# Patient Record
Sex: Female | Born: 1950 | Race: Black or African American | Hispanic: No | Marital: Single | Smoking: Never smoker
Health system: Southern US, Community
[De-identification: ages and names within clinical notes are randomized; demographics above are authoritative.]

## PROBLEM LIST (undated history)

## (undated) DIAGNOSIS — I10 Essential (primary) hypertension: Secondary | ICD-10-CM

## (undated) DIAGNOSIS — E785 Hyperlipidemia, unspecified: Secondary | ICD-10-CM

## (undated) HISTORY — PX: TOTAL VAGINAL HYSTERECTOMY: SHX2548

## (undated) HISTORY — PX: TONSILLECTOMY: SUR1361

## (undated) HISTORY — DX: Hyperlipidemia, unspecified: E78.5

## (undated) HISTORY — PX: APPENDECTOMY: SHX54

## (undated) HISTORY — PX: CHOLECYSTECTOMY: SHX55

---

## 2006-08-15 HISTORY — PX: NM MYOCAR PERF WALL MOTION: HXRAD629

## 2007-08-21 ENCOUNTER — Encounter: Admission: RE | Admit: 2007-08-21 | Discharge: 2007-08-21 | Payer: Self-pay | Admitting: Internal Medicine

## 2007-10-22 ENCOUNTER — Inpatient Hospital Stay (HOSPITAL_COMMUNITY): Admission: AD | Admit: 2007-10-22 | Discharge: 2007-10-24 | Payer: Self-pay | Admitting: General Surgery

## 2007-10-22 ENCOUNTER — Encounter (INDEPENDENT_AMBULATORY_CARE_PROVIDER_SITE_OTHER): Payer: Self-pay | Admitting: General Surgery

## 2007-10-28 ENCOUNTER — Ambulatory Visit (HOSPITAL_COMMUNITY): Admission: RE | Admit: 2007-10-28 | Discharge: 2007-10-28 | Payer: Self-pay | Admitting: Gastroenterology

## 2010-07-04 NOTE — Op Note (Signed)
Sabrina Baldwin, Sabrina Baldwin           ACCOUNT NO.:  [id number]   MEDICAL RECORD NO.:  [id number]          PATIENT TYPE:  INP   LOCATION:  1409                         FACILITY:  [facility]   PHYSICIAN:  John C. Madilyn Fireman, M.D.    DATE OF BIRTH:  Jul 27, 1950   DATE OF PROCEDURE:  10/23/2007  DATE OF DISCHARGE:                               OPERATIVE REPORT   PROCEDURE:  Endoscopic retrograde cholangiopancreatography.   INDICATIONS FOR PROCEDURE:  Common bile duct stones seen on  intraoperative cholangiogram.   DESCRIPTION OF PROCEDURE:  The patient was placed in the prone position  and placed on the pulse monitor with continuous low-flow oxygen  delivered by nasal cannula.  She was sedated with 150 mcg of IV fentanyl  and 10 mg of IV Versed, as well as 1 mg of IV glucagon.  The Olympus  side-viewing endoscope was advanced blindly in the oropharynx, esophagus  and stomach.  The pylorus was traversed with the papilla of Vater  located on the medial duodenal wall  was  enlarged appearing with a  downward pointing very small ampulla.  This was initially cannulated  with a Wilson-Cook sphincterotome resulting in opacification of the  pancreatic duct which appeared normal.  With repositioning, shallow  cannulation of the common bile could be achieved.  However, with shallow  cannulation, the distal CBD appeared to be in a corkscrew or loop  configuration.  Despite two different catheters and Dr. Ewing Schlein assisting,  we were unable to straighten this loop to get the wire to advance into  the proximal common bile duct.  Proximal to the tortuous area, there  were seen 2-3 small stones.  After approximately 1-1/2 hours of  attempts, the procedure was terminated with no sphincterotomy performed  and no free access into the proximal common bile duct.  The patient was  then returned to the recovery room in stable condition.  She tolerated  the procedure well and there were no immediate complications.   IMPRESSION:  Distal common bile duct stone, unable to extract due to  tortuous distal common bile duct.   PLAN:  Observe overnight for complications and will probably refer her  to a tertiary care center for ERCP attempt.           ______________________________  Everardo All. Madilyn Fireman, M.D.     JCH/MEDQ  D:  10/23/2007  T:  10/23/2007  Job:  [id number]   cc:   Juanetta Gosling, MD  [address]  [place] [address]

## 2010-07-04 NOTE — Consult Note (Signed)
Sabrina Baldwin, Sabrina Baldwin           ACCOUNT NO.:  [id number]   MEDICAL RECORD NO.:  [id number]          PATIENT TYPE:  OIB   LOCATION:  1409                         FACILITY:  [facility]   PHYSICIAN:  Shirley Friar, MDDATE OF BIRTH:  26-Dec-1950   DATE OF CONSULTATION:  10/22/2007  DATE OF DISCHARGE:                                 CONSULTATION   REASON:  Common bile duct stones.   HISTORY OF PRESENT ILLNESS:  Sabrina Baldwin is a pleasant 60 year old  black female status post laparoscopic cholecystectomy today for  symptomatic gallstones who was found to have a positive intraoperative  cholangiogram.  A filling defect was seen in the distal common bile duct  concerning for ductal stone.  Several smaller ductal stones were seen to  migrate from the common bile duct into the left hepatic duct under the  injection.  No leakage of contrast material was identified.  Patient has  done well postoperatively and denies any abdominal pain.  She last had  liver function test done on September 26, 2007 per chart record. It showed  ALT of 45, AST of 22, alkaline phosphatase of 120.   PAST MEDICAL HISTORY:  1. Symptomatic gallstones as stated above.  2. Hypertension.  3. Status post appendectomy.  4. Status post total abdominal hysterectomy.  5. Status post bilateral tubal ligation.   CURRENT MEDICINES:  1. Cefoxitin.  2. Hydrocodone p.r.n.  3. Morphine p.r.n.  4. Zofran.   ALLERGIES:  NO KNOWN DRUG ALLERGIES.   FAMILY HISTORY:  Negative for gallstone disease.   SOCIAL HISTORY:  Occasional alcohol, denies smoking.   REVIEW OF SYSTEMS:  Negative from GI standpoint except for as stated  above.   PHYSICAL EXAM:  Temperature 97.4, pulse 51, blood pressure 120/80,  weight of 70 kg, O2 sat 99% on 2 liters.  GENERAL:  Lethargic, but arousable.  No acute distress.  ABDOMEN: Soft, nontender, decreased bowel sounds.   IMPRESSION:  A 60 year old black female with common bile duct stones and  positive intraoperative cholangiogram for common bile duct stones.  Would do liver function tests and lipase tonight and repeat LFTs in the  morning.  The patient may need to have a ERCP on October 23, 2007  pending these labs and her clinical improvement.  Risks and benefits  ERCP were discussed.  She agrees to proceed if necessary.      Shirley Friar, MD  Electronically Signed     VCS/MEDQ  D:  10/22/2007  T:  10/22/2007  Job:  [id number]   cc:   Juanetta Gosling, MD  [address]  [place] [address]   Everardo All. Madilyn Fireman, M.D.  Fax: [phone]

## 2010-07-04 NOTE — Op Note (Signed)
Sabrina Baldwin, Sabrina Baldwin NO.:  [id number]   MEDICAL RECORD NO.:  [id number]          PATIENT TYPE:  AMB   LOCATION:  DAY                          FACILITY:  [facility]   PHYSICIAN:  Juanetta Gosling, MDDATE OF BIRTH:  01-15-1951   DATE OF PROCEDURE:  10/22/2007  DATE OF DISCHARGE:                               OPERATIVE REPORT   PREOPERATIVE DIAGNOSES:  1. Symptomatic cholelithiasis.  2. Elevated liver function tests.   POSTOPERATIVE DIAGNOSES:  1. Symptomatic cholelithiasis.  2. Elevated liver function tests.  3. Retained common duct stones.   PROCEDURE:  Laparoscopic cholecystectomy with intraoperative  cholangiogram.   SURGEON:  Juanetta Gosling, MD.   ASSISTANT:  Ollen Gross. Vernell Morgans, M.D.   ANESTHESIA:  General.   SPECIMENS:  Gallbladder to pathology.   ESTIMATED BLOOD LOSS:  Minimal.   FINDINGS:  Very contracted and scarred in gallbladder with an IOC with  retained stones.   DISPOSITION:  To PACU stable.   COMPLICATIONS:  None.   INDICATIONS:  Mrs. Sabrina Baldwin is a 60 year old female with approximately a  year and a half of symptoms of indigestion and intermittent right upper  quadrant pain, especially with spicy foods.  She was seen by her primary  care physician for these complaints and had a mildly elevated GGT on her  lab exam, was sent for a right upper quadrant ultrasound that showed a  mildly echogenic liver, mildly prominent possibly due to a nonvisualized  distal common duct stone and cholelithiasis.  Her prior history includes  hypertension appendectomy and a total abdominal hysterectomy.  I  counseled her for a laparoscopic cholecystectomy with a cholangiogram  due to her abnormal GGT and alkaline phosphatase.   PROCEDURE:  After informed consent was obtained, the patient was  administered 1 gram of cefoxitin.  She was then taken to the operating  room where she had sequential compression devices placed on her lower  extremities.  She  then underwent general endotracheal anesthesia without  complication.  Her abdomen was then prepped and draped in standard  sterile surgical fashion  after a surgical time-out was performed.  A 10  mL vertical incision was then made below her belly button and dissection  was carried out down to the level of the fascia.  This was incised  sharply.  The peritoneum was entered bluntly.  The 0 Vicryl pursestring  suture was then placed and the Hasson trocar inserted into this  incision.  Abdomen was insufflated to 15 mmHg of pressure which she  tolerated well.  Three further 5 mm ports were placed in the epigastrium  and right upper quadrant under direct vision after infiltration with  local anesthetic.  Her gallbladder was noted to be very adherent to her  omentum and there was a large amount of scarring and her gallbladder  also appeared very contracted.  Multiple adhesions were lysed to the  gallbladder with a small amount of liver bleeding during this portion of  the procedure.  The triangle of Calot was then dissected.  The cystic  artery was clearly identified as was the cystic duct obtaining the  critical view of safety.  Her cystic duct appeared very large in nature.  I first clipped the cystic artery and cut her cystic artery and then put  a clip on the distal cystic duct.  I was still concerned that there was  gallbladder distal to where I was versus a dilated cystic duct so I did  shoot the cholangiogram for anatomy as well as her LFTs.  I inserted a  Cook catheter in the right upper quadrant and made a cut in the cystic  duct, inserted the catheter into this and clipped it into position.  Cholangiogram was then performed with the findings  that I was in the  cystic duct.  There was filling of both the left and right hepatic  radicals.  There was flow into the duodenum but there were multiple  abnormalities consistent with stones on her cholangiogram.  Following  this, the abdomen  was reinsufflated.  The catheter and clip were  removed.  The cystic duct was then clipped and divided.  There were some  stones milked out of this that were removed prior to this.  The  electrocautery was then used to remove the gallbladder from the liver  bed.  There were 2 other structures that might have been small ducts of  Luschka that I also clipped and divided.  As the gallbladder was about  to be removed, we held it cephalad and irrigated it.  There was no  evidence of any bleeding or bile leakage.  I did place an Endoloop using  a [place] Endoloop on the cystic duct stump due to the fact that she  may get an ERCP postoperatively.  Hemostasis was observed.  Due to the  dissection in the area in the liver were this was stuck,  I did place a  piece of Surgicel in the bed of the gallbladder.  The gallbladder was  then removed with electrocautery.  The camera was moved to the  epigastric position.  EndoCatch bag was inserted.  The gallbladder was  placed in the bag and removed out of the umbilicus.  The umbilical  pursestring suture was then tied with good closure.  Abdomen was then  desufflated.  All ports were then removed.  Wounds were closed with a 4-  0 Monocryl and Dermabond were placed over these.  She tolerated this  well and was extubated in the operating room.  She will be admitted  overnight and I have placed a consult to the gastroenterologist.  We  will make her n.p.o. after midnight and recheck her LFTs in the morning  for a possible ERCP.      Juanetta Gosling, MD  Electronically Signed     MCW/MEDQ  D:  10/22/2007  T:  10/23/2007  Job:  [id number]

## 2010-07-04 NOTE — Op Note (Signed)
Sabrina Baldwin, Sabrina Baldwin           ACCOUNT NO.:  [id number]   MEDICAL RECORD NO.:  [id number]          PATIENT TYPE:  AMB   LOCATION:  ENDO                         FACILITY:  [facility]   PHYSICIAN:  Petra Kuba, M.D.    DATE OF BIRTH:  1950/04/29   DATE OF PROCEDURE:  10/28/2007  DATE OF DISCHARGE:                               OPERATIVE REPORT   PROCEDURE:  Endoscopic retrograde cholangiopancreatography,  sphincterotomy, and stone extraction.   INDICATIONS:  CBD stone on previous ERCP and intraop cholangiogram  elevated liver test.  Consent was signed after risks, benefits, methods,  and options were thoroughly discussed multiple times in the past.   MEDICINES USED:  1. Fentanyl 75 mcg.  2. Versed 10 mg.  3. Phenergan 12.5 mg.  4. Glucagon 0.5 mg.   PROCEDURE:  The side-viewing therapeutic video duodenoscope was inserted  by indirect vision into the stomach and advanced through normal pylorus  into the duodenum and her bulbous essentially unchanged ampulla was  brought into view.  Using the triple-lumen sphincterotome loaded with  the Jagwire, we were able to get CBD cannulation.  Unfortunately, we are  trying to advance the wire, we would lose our position.  After a few  attempts, we went ahead and rolled her on her side and on the first  attempt we were able to get deep selective cannulation.  CBD was  slightly dilated, no obvious stones were seen on the injection of  contrast,  the intrahepatics were normal.  The wire was advanced into  the intrahepatic.  We went ahead and proceeded with a medium-sized  sphincterotomy until we had adequate biliary drainage and can get the  fully bowed sphincterotome easily in and out of the duct.  We then  exchanged the sphincterotome for the adjustable 12-15 mm balloon and on  the first balloon pull through at 12 mm, the stone was delivered.  We  went ahead and proceeded with at least 4-5 balloon pull throughs.  We  could not withdraw the 15  through the patent sphincterotomy site but we  could pull into the distal part of the duct and then lowered to the 12  and no other stones or debris were seen or removed.  The balloon did  pass readily through the patent sphincterotomy site and was on the 12 mm  mark.  We did try to do an occlusion cholangiogram twice.  Unfortunately, there was too much air in the system and could not tell  residual stones versus air bubbles and after prolonged effort, we  elected to stop the procedure.  There was adequate biliary drainage.  Scope was removed.  The patient tolerated the procedure well.  There was  no obvious immediate complications.   ENDOSCOPIC DIAGNOSES:  1. Bulbous ampulla.  2. No PD injections.  3. Unable to get deep selective cannulation on her belly but able to      get deep selective cannulation with her on the left side.  4. No stones seen on initial injection.  5. Medium sized sphincterotomy done.  6. Small stone removed with the adjustable 12-15-mm adjustable  balloon.  7. Multiple 12-mm balloon pull throughs without any further stones or      debris being delivered, unable to successfully get an occlusion      cholangiogram secondary to increased retained air in the system.   PLAN:  Observe for delayed complications.  If none, can go home today  and slowly advance diet.  We will ask the surgeon to repeat liver tests  on followup in a week or two and either myself or Dr. Madilyn Fireman have to see  back p.r.n.  We will hold aspirin and nonsteroidals for 2 weeks and  allow to go back to work next week.           ______________________________  Petra Kuba, M.D.     MEM/MEDQ  D:  10/28/2007  T:  10/29/2007  Job:  [id number]   cc:   Juanetta Gosling, MD  Everardo All. Madilyn Fireman, M.D.

## 2010-11-22 LAB — HEPATIC FUNCTION PANEL
AST: 271 — ABNORMAL HIGH
AST: 337 — ABNORMAL HIGH
Albumin: 3 — ABNORMAL LOW
Alkaline Phosphatase: 141 — ABNORMAL HIGH
Bilirubin, Direct: 0.3
Total Protein: 5.7 — ABNORMAL LOW
Total Protein: 6.5

## 2010-11-22 LAB — CBC
Platelets: 343
RDW: 12.9

## 2010-11-22 LAB — COMPREHENSIVE METABOLIC PANEL
ALT: 261 — ABNORMAL HIGH
ALT: 97 — ABNORMAL HIGH
AST: 149 — ABNORMAL HIGH
AST: 22
Albumin: 3.7
Alkaline Phosphatase: 131 — ABNORMAL HIGH
CO2: 29
Chloride: 106
Creatinine, Ser: 0.9
GFR calc Af Amer: >60
GFR calc non Af Amer: >60
Potassium: 3.6
Sodium: 137
Sodium: 142
Total Bilirubin: 1.1
Total Protein: 7.2

## 2010-11-22 LAB — PROTIME-INR: INR: 0.9

## 2010-11-22 LAB — ABO/RH: ABO/RH(D): O POS

## 2010-11-22 LAB — TYPE AND SCREEN

## 2012-04-07 ENCOUNTER — Encounter (HOSPITAL_COMMUNITY): Payer: Self-pay | Admitting: *Deleted

## 2012-04-07 ENCOUNTER — Other Ambulatory Visit: Payer: Self-pay | Admitting: Obstetrics and Gynecology

## 2012-04-07 DIAGNOSIS — Z1231 Encounter for screening mammogram for malignant neoplasm of breast: Secondary | ICD-10-CM

## 2012-04-15 ENCOUNTER — Ambulatory Visit (HOSPITAL_COMMUNITY)
Admission: RE | Admit: 2012-04-15 | Discharge: 2012-04-15 | Disposition: A | Discharge status: Home / Self Care | Payer: Self-pay | Source: Ambulatory Visit | Attending: Obstetrics and Gynecology | Admitting: Obstetrics and Gynecology

## 2012-04-15 ENCOUNTER — Encounter (HOSPITAL_COMMUNITY): Payer: Self-pay

## 2012-04-15 VITALS — BP 108/76 | Temp 98.1°F | Ht 66.0 in | Wt 133.0 lb

## 2012-04-15 DIAGNOSIS — Z01419 Encounter for gynecological examination (general) (routine) without abnormal findings: Secondary | ICD-10-CM

## 2012-04-15 HISTORY — DX: Essential (primary) hypertension: I10

## 2012-04-15 NOTE — Progress Notes (Signed)
No complaints today.  Pap Smear:  Pap smear completed today. Per patient last Pap smear was 3 years ago and normal. Per patient no history of an abnormal Pap smear. No Pap smear results in EPIC.  Physical exam: Breasts Breasts symmetrical. No skin abnormalities bilateral breasts. No nipple retraction bilateral breasts. No nipple discharge bilateral breasts. No lymphadenopathy. No lumps palpated bilateral breasts. No complaints of pain or tenderness on exam. Patient escorted to mammography for a screening mammogram.         Pelvic/Bimanual   Ext Genitalia No lesions, no swelling and no discharge observed on external genitalia.         Vagina Vagina pink and normal texture. No lesions and small amount of thick white discharge observed in vagina.          Cervix Cervix is present. Cervix pink and of normal texture. No discharge observed.     Uterus Uterus is present and palpable. Uterus in normal position and normal size.        Adnexae Bilateral ovaries present and palpable. No tenderness on palpation.          Rectovaginal No rectal exam completed today since patient had no rectal complaints. No skin abnormalities observed on exam.

## 2012-04-15 NOTE — Patient Instructions (Signed)
Taught patient how to perform BSE. Let her know [facility] will cover Pap smears every 3 years unless has a history of abnormal Pap smears. Let patient know will follow up with her within the next couple weeks with results by letter or phone. Patient verbalized understanding. Patient escorted to mammography for a screening mammogram.

## 2012-10-22 ENCOUNTER — Other Ambulatory Visit: Payer: Self-pay | Admitting: Cardiology

## 2012-10-22 NOTE — Telephone Encounter (Signed)
Rx was sent to pharmacy electronically. 

## 2012-11-01 ENCOUNTER — Other Ambulatory Visit: Payer: Self-pay | Admitting: Cardiology

## 2012-11-03 NOTE — Telephone Encounter (Signed)
Rx was sent to pharmacy electronically. 

## 2012-12-18 ENCOUNTER — Encounter: Payer: Self-pay | Admitting: Internal Medicine

## 2013-02-06 ENCOUNTER — Ambulatory Visit (AMBULATORY_SURGERY_CENTER): Payer: Self-pay

## 2013-02-06 ENCOUNTER — Encounter: Payer: Self-pay | Admitting: Internal Medicine

## 2013-02-06 VITALS — Ht 66.0 in | Wt 137.0 lb

## 2013-02-06 DIAGNOSIS — Z1211 Encounter for screening for malignant neoplasm of colon: Secondary | ICD-10-CM

## 2013-02-06 MED ORDER — SUPREP BOWEL PREP KIT 17.5-3.13-1.6 GM/177ML PO SOLN: 1.0000 | Freq: Once | ORAL | Status: DC

## 2013-02-27 ENCOUNTER — Ambulatory Visit (AMBULATORY_SURGERY_CENTER): Payer: BC Managed Care – PPO | Admitting: Internal Medicine

## 2013-02-27 ENCOUNTER — Encounter: Payer: Self-pay | Admitting: Internal Medicine

## 2013-02-27 VITALS — BP 113/72 | HR 61 | Temp 98.0°F | Resp 20 | Ht 66.0 in | Wt 137.0 lb

## 2013-02-27 DIAGNOSIS — Z1211 Encounter for screening for malignant neoplasm of colon: Secondary | ICD-10-CM

## 2013-02-27 MED ORDER — SODIUM CHLORIDE 0.9 % IV SOLN: 500.0000 mL | INTRAVENOUS | Status: DC

## 2013-02-27 NOTE — Patient Instructions (Addendum)
Your colonoscopy was normal. Prep was great.  Next routine colonoscopy in 10 years - 2025  I appreciate the opportunity to care for you. Iva Booparl E. Gessner, MD, FACG  YOU HAD AN ENDOSCOPIC PROCEDURE TODAY AT THE [facility]: Refer to the procedure report that was given to you for any specific questions about what was found during the examination.  If the procedure report does not answer your questions, please call your gastroenterologist to clarify.  If you requested that your care partner not be given the details of your procedure findings, then the procedure report has been included in a sealed envelope for you to review at your convenience later.  YOU SHOULD EXPECT: Some feelings of bloating in the abdomen. Passage of more gas than usual.  Walking can help get rid of the air that was put into your GI tract during the procedure and reduce the bloating. If you had a lower endoscopy (such as a colonoscopy or flexible sigmoidoscopy) you may notice spotting of blood in your stool or on the toilet paper. If you underwent a bowel prep for your procedure, then you may not have a normal bowel movement for a few days.  DIET: Your first meal following the procedure should be a light meal and then it is ok to progress to your normal diet.  A half-sandwich or bowl of soup is an example of a good first meal.  Heavy or fried foods are harder to digest and may make you feel nauseous or bloated.  Likewise meals heavy in dairy and vegetables can cause extra gas to form and this can also increase the bloating.  Drink plenty of fluids but you should avoid alcoholic beverages for 24 hours.  ACTIVITY: Your care partner should take you home directly after the procedure.  You should plan to take it easy, moving slowly for the rest of the day.  You can resume normal activity the day after the procedure however you should NOT DRIVE or use heavy machinery for 24 hours (because of the sedation medicines used during  the test).    SYMPTOMS TO REPORT IMMEDIATELY: A gastroenterologist can be reached at any hour.  During normal business hours, 8:30 AM to 5:00 PM Monday through Friday, call [phone].  After hours and on weekends, please call the GI answering service at [phone] who will take a message and have the physician on call contact you.   Following lower endoscopy (colonoscopy or flexible sigmoidoscopy):  Excessive amounts of blood in the stool  Significant tenderness or worsening of abdominal pains  Swelling of the abdomen that is new, acute  Fever of 100F or higher   FOLLOW UP: If any biopsies were taken you will be contacted by phone or by letter within the next 1-3 weeks.  Call your gastroenterologist if you have not heard about the biopsies in 3 weeks.  Our staff will call the home number listed on your records the next business day following your procedure to check on you and address any questions or concerns that you may have at that time regarding the information given to you following your procedure. This is a courtesy call and so if there is no answer at the home number and we have not heard from you through the emergency physician on call, we will assume that you have returned to your regular daily activities without incident.  SIGNATURES/CONFIDENTIALITY: You and/or your care partner have signed paperwork which will be entered into your electronic medical  record.  These signatures attest to the fact that that the information above on your After Visit Summary has been reviewed and is understood.  Full responsibility of the confidentiality of this discharge information lies with you and/or your care-partner.  Repeat colonoscopy in 10 years.-2025

## 2013-02-27 NOTE — Progress Notes (Addendum)
Called to room to assist during endoscopic procedure.  Patient ID and intended procedure confirmed with present staff. Received instructions for my participation in the procedure from the performing physician.   This path form was created in error.  There was no path on this patient at all.     Note to cancel path report states that there was an unretrieved path.

## 2013-02-27 NOTE — Progress Notes (Signed)
Pt stable to RR 

## 2013-02-27 NOTE — Op Note (Signed)
[facility] [address] [place], [id number]   COLONOSCOPY PROCEDURE REPORT  PATIENT: Sabrina Neasownsend, Sabrina Baldwin  MR#: [id number] BIRTHDATE: [birth year] , 62  yrs. old GENDER: Female ENDOSCOPIST: Iva Booparl E Vale Mousseau, MD, [facility] REFERRED XB:JYNWGBY:Scott Link SnufferHolwerda, M.D. PROCEDURE DATE:  02/27/2013 PROCEDURE:   Colonoscopy, screening First Screening Colonoscopy - Avg.  risk and is 50 yrs.  old or older Yes.  Prior Negative Screening - Now for repeat screening. N/A  History of Adenoma - Now for follow-up colonoscopy & has been > or = to 3 yrs.  Polyps Removed Today? No.  Recommend repeat exam, <10 yrs? No. ASA CLASS:   Class II INDICATIONS:average risk screening and first colonoscopy. MEDICATIONS: propofol (Diprivan) 200mg  IV, MAC sedation, administered by CRNA, and These medications were titrated to patient response per physician's verbal order  DESCRIPTION OF PROCEDURE:   After the risks benefits and alternatives of the procedure were thoroughly explained, informed consent was obtained.  A digital rectal exam revealed no abnormalities of the rectum.   The     endoscope was introduced through the anus and advanced to the cecum, which was identified by both the appendix and ileocecal valve. No adverse events experienced.   The quality of the prep was excellent using Suprep The instrument was then slowly withdrawn as the colon was fully examined.      COLON FINDINGS: A normal appearing cecum, ileocecal valve, and appendiceal orifice were identified.  The ascending, hepatic flexure, transverse, splenic flexure, descending, sigmoid colon and rectum appeared unremarkable.  No polyps or cancers were seen.   A right colon retroflexion was performed.  Retroflexed views revealed no abnormalities. The time to cecum=2 minutes 22 seconds. Withdrawal time=10 minutes 54 seconds.  The scope was withdrawn and the procedure completed. COMPLICATIONS: There were no complications.  ENDOSCOPIC  IMPRESSION: Normal colonoscopy - excellent prep - first colonoscopy  RECOMMENDATIONS: Repeat colonoscopy 10 years - 2025   eSigned:  Iva Booparl E Mckinze Poirier, MD, [facility]FACG 02/27/2013 8:33 AM   cc: The Patient  and Alysia PennaScott Holwerda, MD

## 2013-03-02 ENCOUNTER — Telehealth: Payer: Self-pay | Admitting: *Deleted

## 2013-03-02 NOTE — Telephone Encounter (Signed)
  Follow up Call-  Call back number 02/27/2013  Post procedure Call Back phone  # [phone]  Permission to leave phone message Yes     Patient questions:  Do you have a fever, pain , or abdominal swelling? no Pain Score  0 *  Have you tolerated food without any problems? yes  Have you been able to return to your normal activities? yes  Do you have any questions about your discharge instructions: Diet   no Medications  no Follow up visit  no  Do you have questions or concerns about your Care? no  Actions: * If pain score is 4 or above: No action needed, pain <4.

## 2013-03-06 NOTE — Addendum Note (Signed)
Addended by: Clide CliffHODGES, SUZANNE D on: 03/06/2013 10:07 AM   Modules accepted: Orders

## 2013-06-24 ENCOUNTER — Encounter: Payer: Self-pay | Admitting: *Deleted

## 2013-06-26 ENCOUNTER — Ambulatory Visit (INDEPENDENT_AMBULATORY_CARE_PROVIDER_SITE_OTHER): Payer: BC Managed Care – PPO | Admitting: Cardiology

## 2013-06-26 ENCOUNTER — Encounter: Payer: Self-pay | Admitting: Cardiology

## 2013-06-26 VITALS — BP 118/60 | HR 63 | Ht 66.0 in | Wt 137.5 lb

## 2013-06-26 DIAGNOSIS — I10 Essential (primary) hypertension: Secondary | ICD-10-CM

## 2013-06-26 DIAGNOSIS — E785 Hyperlipidemia, unspecified: Secondary | ICD-10-CM

## 2013-06-26 NOTE — Patient Instructions (Signed)
No changes , continue with current medication.  Your physician wants you to follow-up in 12 month Dr Herbie BaltimoreHarding.  You will receive a reminder letter in the mail two months in advance. If you don't receive a letter, please call our office to schedule the follow-up appointment.

## 2013-06-28 ENCOUNTER — Encounter: Payer: Self-pay | Admitting: Cardiology

## 2013-06-28 DIAGNOSIS — I10 Essential (primary) hypertension: Secondary | ICD-10-CM | POA: Insufficient documentation

## 2013-06-28 DIAGNOSIS — E785 Hyperlipidemia, unspecified: Secondary | ICD-10-CM | POA: Insufficient documentation

## 2013-06-28 NOTE — Progress Notes (Signed)
PATIENT: Sabrina NeasGardenia Laduca MRN: [id number]  DOB: [birth year]   DOV:06/28/2013 PCP: Alysia PennaHOLWERDA, SCOTT, MD  Clinic Note: Chief Complaint  Patient presents with  . 12 MONTHS VISIT    NO CHEST PAIN, NO SOB,  NO EDEMA,  LAB DONE AT PCP THIS YEAR PER PATIENT   HPI: Sabrina Baldwin is a 63 y.o.  female with a PMH below who presents today for followup of her hypertension. She really does not have any other active cardiac conditions in the setting of cardiac evaluation in the past. The last stress test she had a Cardiolite in October 2008 that was negative for ischemia.  Interval History: She presents today doing quite well without any complaints. She admittedly has been "lazy "and not been doing exercises that she is supposed to do. Otherwise really with routine activity she denies any active cardiac symptoms. No chest pain or shortness of breath with rest or exertion. No PND, orthopnea or edema. No palpitations, lightheadedness, dizziness, weakness or syncope/near syncope. No TIA/amaurosis fugax symptoms. No melena, hematochezia, hematuria, or epistaxis. No claudication.  Past Medical History  Diagnosis Date  . Hypertension   . Dyslipidemia    Prior Cardiac Evaluation and Past Surgical History: Past Surgical History  Procedure Laterality Date  . Cholecystectomy    . Tonsillectomy    . Appendectomy    . Total vaginal hysterectomy    . Nm myocar perf wall motion  08/15/2006    protocol:Bruce, post stress EF:71%, Exercise Capacity 8METS, EKG negative for Ischemia.    No Known Allergies  Current Outpatient Prescriptions  Medication Sig Dispense Refill  . lisinopril-hydrochlorothiazide (PRINZIDE,ZESTORETIC) 20-25 MG per tablet Take 1 tablet by mouth daily.  90 tablet  2   No current facility-administered medications for this visit.    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 She does not smoke  herself. She does lots of walking, but no routine exercise.   ROS: A comprehensive Review of Systems - Essentially negative besides mild arthralgias  PHYSICAL EXAM BP 118/60  Pulse 63  Ht 5\' 6"  (1.676 m)  Wt 137 lb 8 oz (62.37 kg)  BMI 22.20 kg/m2 General appearance: alert, cooperative, appears stated age, no distress and Healthy-appearing Neck: no adenopathy, no carotid bruit, no JVD, supple, symmetrical, trachea midline and thyroid not enlarged, symmetric, no tenderness/mass/nodules Lungs: clear to auscultation bilaterally and normal percussion bilaterally Heart: regular rate and rhythm, S1, S2 normal, no murmur, click, rub or gallop and normal apical impulse Abdomen: soft, non-tender; bowel sounds normal; no masses,  no organomegaly Extremities: extremities normal, atraumatic, no cyanosis or edema Pulses: 2+ and symmetric Neurologic: Alert and oriented X 3, normal strength and tone. Normal symmetric reflexes. Normal coordination and gait   Adult ECG Report  Rate: 63 ;  Rhythm: normal sinus rhythm  QRS Axis: 57 ;  PR Interval: 136 ;  QRS Duration: 80 ; QTc: 464;  Voltages: normal  Conduction Disturbances: none  Other Abnormalities: none  Narrative Interpretation: Normal EKG  Recent Labs: none -- PCP checks lipids  ASSESSMENT / PLAN: Hypertension Well-controlled on home regimen..  Dyslipidemia As increasing well-controlled with diet and exercise. As best I understand him this is not being followed by PCP   No orders of the defined types were placed in this encounter.   No orders of the defined types were placed in this encounter.    Followup: One year  DAVID W. Herbie BaltimoreHARDING, M.D.,  M.S. Interventional Cardiology [facility]

## 2013-06-28 NOTE — Assessment & Plan Note (Signed)
As increasing well-controlled with diet and exercise. As best I understand him this is not being followed by PCP

## 2013-06-28 NOTE — Assessment & Plan Note (Addendum)
Well-controlled on home regimen..Marland Kitchen

## 2013-06-29 ENCOUNTER — Encounter: Payer: Self-pay | Admitting: Cardiology

## 2013-11-25 ENCOUNTER — Other Ambulatory Visit: Payer: Self-pay | Admitting: Cardiology

## 2013-11-25 NOTE — Telephone Encounter (Signed)
Rx was sent to pharmacy electronically. 

## 2013-12-02 ENCOUNTER — Other Ambulatory Visit: Payer: Self-pay | Admitting: Cardiology

## 2013-12-09 ENCOUNTER — Other Ambulatory Visit: Payer: Self-pay | Admitting: Internal Medicine

## 2013-12-09 DIAGNOSIS — Z1239 Encounter for other screening for malignant neoplasm of breast: Secondary | ICD-10-CM

## 2013-12-21 ENCOUNTER — Encounter: Payer: Self-pay | Admitting: Cardiology

## 2014-01-05 ENCOUNTER — Ambulatory Visit
Admission: RE | Admit: 2014-01-05 | Discharge: 2014-01-05 | Disposition: A | Discharge status: Home / Self Care | Payer: BC Managed Care – PPO | Source: Ambulatory Visit | Attending: Internal Medicine | Admitting: Internal Medicine

## 2014-01-05 DIAGNOSIS — Z1239 Encounter for other screening for malignant neoplasm of breast: Secondary | ICD-10-CM

## 2014-01-08 ENCOUNTER — Other Ambulatory Visit: Payer: Self-pay | Admitting: Internal Medicine

## 2014-01-08 DIAGNOSIS — R928 Other abnormal and inconclusive findings on diagnostic imaging of breast: Secondary | ICD-10-CM

## 2014-01-25 ENCOUNTER — Ambulatory Visit
Admission: RE | Admit: 2014-01-25 | Discharge: 2014-01-25 | Disposition: A | Discharge status: Home / Self Care | Payer: BC Managed Care – PPO | Source: Ambulatory Visit | Attending: Internal Medicine | Admitting: Internal Medicine

## 2014-01-25 DIAGNOSIS — R928 Other abnormal and inconclusive findings on diagnostic imaging of breast: Secondary | ICD-10-CM

## 2014-08-17 ENCOUNTER — Other Ambulatory Visit: Payer: Self-pay | Admitting: *Deleted

## 2014-08-17 MED ORDER — LISINOPRIL-HYDROCHLOROTHIAZIDE 20-25 MG PO TABS: 1.0000 | ORAL_TABLET | Freq: Every day | ORAL | Status: DC

## 2014-08-17 NOTE — Telephone Encounter (Signed)
E-SENT TO PHARMACY   PATIENT AWARE TO KEEP APPOINTMENT IN  SEPT 2016

## 2014-10-21 ENCOUNTER — Ambulatory Visit (INDEPENDENT_AMBULATORY_CARE_PROVIDER_SITE_OTHER): Payer: BLUE CROSS/BLUE SHIELD | Admitting: Cardiology

## 2014-10-21 ENCOUNTER — Encounter: Payer: Self-pay | Admitting: Cardiology

## 2014-10-21 VITALS — BP 106/76 | HR 58 | Ht 66.0 in | Wt 144.2 lb

## 2014-10-21 DIAGNOSIS — I10 Essential (primary) hypertension: Secondary | ICD-10-CM

## 2014-10-21 DIAGNOSIS — E785 Hyperlipidemia, unspecified: Secondary | ICD-10-CM | POA: Diagnosis not present

## 2014-10-21 NOTE — Assessment & Plan Note (Addendum)
Well-controlled on combination medication. No changes needed. I spent some time explaining to her the importance of long-term hypertension care. She was concerned about the duration of the need to take antihypertensive medications.  I also told her that with this particular medicine that would be fine to hold those here and there if she wasn't feeling well and avoid hypotension.

## 2014-10-21 NOTE — Patient Instructions (Signed)
NO CHANGE WITH CURRENT MEDICATIONS    Your physician wants you to follow-up in 12 MONTHS WITH DR HARDING.You will receive a reminder letter in the mail two months in advance. If you don't receive a letter, please call our office to schedule the follow-up appointment.  

## 2014-10-21 NOTE — Assessment & Plan Note (Signed)
Monitored by PCP. I don't unfortunately have any labs to review, but she says that there doesn't been well controlled with diet and exercise.

## 2014-10-21 NOTE — Progress Notes (Signed)
PCP: Alysia Penna, MD  Clinic Note: Chief Complaint  Patient presents with  . Annual Exam    pt c/o leg pain and weakness (sometimes both legs)/ feels a knot on her right foot  . Fatigue    occasionally    HPI: Sabrina Baldwin is a 64 y.o. female with a PMH below who presents today for f/u of her HTN &HLD.  Sabrina Baldwin was last seen in May 2015.  Recent Hospitalizations: n/a  Studies Reviewed: n/a  Interval History:    She presents for delayed annual visit with no major complaints. She does note that after long days of being on her feet her legs will hurt sometimes. She has maybe mild swelling, but nothing significant. She remains active, does not routinely exercise. She is on her feet all day long at work and does not feel like doing much when she gets home.he does feel that she is fatigued at the end of a long day.  She has a stable cardiac standpoint.  Cardiac review of symptoms: No chest pain or shortness of breath with rest or exertion.  No PND, orthopnea.  No palpitations, lightheadedness, dizziness, weakness or syncope/near syncope. No TIA/amaurosis fugax symptoms.  Past Medical History  Diagnosis Date  . Hypertension   . Dyslipidemia     Past Surgical History  Procedure Laterality Date  . Cholecystectomy    . Tonsillectomy    . Appendectomy    . Total vaginal hysterectomy    . Nm myocar perf wall motion  08/15/2006    protocol:Bruce, post stress EF:71%, Exercise Capacity , EKG negative for Ischemia.    ROS: A comprehensive was performed. Review of Systems  Constitutional: Negative for malaise/fatigue.  Respiratory: Negative for cough and shortness of breath.   Cardiovascular: Positive for leg swelling (Mild). Negative for claudication.  Gastrointestinal: Negative for blood in stool and melena.  Genitourinary: Negative for hematuria.  Musculoskeletal: Negative for myalgias.       Bilateral leg & foot pain with mild swelling @ end of long  day.  Neurological: Negative for dizziness.  Endo/Heme/Allergies: Does not bruise/bleed easily.  All other systems reviewed and are negative.   Prior to Admission medications   Medication Sig Start Date End Date Taking? Authorizing Provider  lisinopril-hydrochlorothiazide (PRINZIDE,ZESTORETIC) 20-25 MG per tablet Take 1 tablet by mouth daily. 08/17/14  Yes Marykay Lex, MD   No Known Allergies   Social History   Social History  . Marital Status: Single    Spouse Name: N/A  . Number of Children: N/A  . Years of Education: N/A   Social History Main Topics  . Smoking status: Never Smoker   . Smokeless tobacco: Never Used  . Alcohol Use: 1.2 - 1.8 oz/week    2-3 Glasses of wine per week     Comment: occassionally  . Drug Use: No  . Sexual Activity: Yes    Birth Control/ Protection: None   Other Topics Concern  . 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 She does lots of walking, but no routine exercise.   Family History  Problem Relation Age of Onset  . Cancer Mother   . Hypertension Brother     Wt Readings from Last 3 Encounters:  10/21/14 144 lb 3.2 oz (65.409 kg)  06/26/13 137 lb 8 oz (62.37 kg)  02/27/13 137 lb (62.143 kg)  PHYSICAL EXAM BP 106/76 mmHg  Pulse 58  Ht  (1.676 m)  Wt 144 lb 3.2 oz (65.409 kg)  BMI 23.29 kg/m2 General appearance: alert, cooperative, appears stated age, no distress and Healthy-appearing Neck: no adenopathy, no carotid bruit, no JVD, supple, symmetrical, trachea midline and thyroid not enlarged, symmetric, no tenderness/mass/nodules Lungs: clear to auscultation bilaterally and normal percussion bilaterally Heart: regular rate and rhythm, S1, S2 normal, no murmur, click, rub or gallop and normal apical impulse Abdomen: soft, non-tender; bowel sounds normal; no masses, no organomegaly Extremities:  extremities normal, atraumatic, no cyanosis or edema Pulses: 2+ and symmetric Neurologic: Alert and oriented X 3, normal strength and tone. Normal symmetric reflexes. Normal coordination and gait   Adult ECG Report  Rate: 58 ;  Rhythm: sinus bradycardia and otherwise normal axis, intervals, durations;   Narrative Interpretation: Otherwise normal EKG   Other studies Reviewed: Additional studies/ records that were reviewed today include:  Recent Labs:  Checked by PCP    ASSESSMENT / PLAN: Problem List Items Addressed This Visit    None      Current medicines are reviewed at length with the patient today. (+/- concerns) How  The following changes have been made: none Studies Ordered:   No orders of the defined types were placed in this encounter.    Follow-up 1 yr.  I gave her the option of simply seeing her PCP vs. Annual f/u with me -- she wants to continue with annual visits.   Marykay Lex, M.D., M.S. Interventional Cardiologist   Pager # [phone]

## 2014-11-30 ENCOUNTER — Other Ambulatory Visit: Payer: Self-pay | Admitting: Cardiology

## 2014-11-30 NOTE — Telephone Encounter (Signed)
Rx request sent to pharmacy.  

## 2015-09-23 ENCOUNTER — Other Ambulatory Visit: Payer: Self-pay | Admitting: Cardiology

## 2015-09-23 NOTE — Telephone Encounter (Signed)
Rx(s) sent to pharmacy electronically.  

## 2015-12-28 ENCOUNTER — Other Ambulatory Visit: Payer: Self-pay | Admitting: Cardiology

## 2015-12-28 NOTE — Telephone Encounter (Signed)
Rx has been sent to the pharmacy electronically. ° °

## 2018-01-08 ENCOUNTER — Other Ambulatory Visit: Payer: Self-pay | Admitting: Internal Medicine

## 2018-01-08 DIAGNOSIS — Z1231 Encounter for screening mammogram for malignant neoplasm of breast: Secondary | ICD-10-CM

## 2019-05-17 ENCOUNTER — Encounter (HOSPITAL_COMMUNITY): Payer: Self-pay

## 2019-05-17 ENCOUNTER — Emergency Department (HOSPITAL_COMMUNITY)
Admission: EM | Admit: 2019-05-17 | Discharge: 2019-05-17 | Disposition: A | Discharge status: Home / Self Care | Payer: BC Managed Care – PPO | Attending: Emergency Medicine | Admitting: Emergency Medicine

## 2019-05-17 ENCOUNTER — Other Ambulatory Visit: Payer: Self-pay

## 2019-05-17 DIAGNOSIS — Y9389 Activity, other specified: Secondary | ICD-10-CM | POA: Diagnosis not present

## 2019-05-17 DIAGNOSIS — M791 Myalgia, unspecified site: Secondary | ICD-10-CM | POA: Diagnosis present

## 2019-05-17 DIAGNOSIS — Y92410 Unspecified street and highway as the place of occurrence of the external cause: Secondary | ICD-10-CM | POA: Diagnosis not present

## 2019-05-17 DIAGNOSIS — I10 Essential (primary) hypertension: Secondary | ICD-10-CM | POA: Diagnosis not present

## 2019-05-17 DIAGNOSIS — Z79899 Other long term (current) drug therapy: Secondary | ICD-10-CM | POA: Diagnosis not present

## 2019-05-17 DIAGNOSIS — Y999 Unspecified external cause status: Secondary | ICD-10-CM | POA: Diagnosis not present

## 2019-05-17 MED ORDER — TRAMADOL HCL 50 MG PO TABS: 50.0000 mg | ORAL_TABLET | Freq: Four times a day (QID) | ORAL | 0 refills | Status: DC | PRN
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
## 2019-05-17 NOTE — Discharge Instructions (Addendum)
Take ibuprofen 600 mg every 6 hours pain.  Take tramadol as prescribed as needed for pain not relieved with ibuprofen.  Return to the emergency department if you develop any new and/or concerning symptoms.

## 2020-01-26 ENCOUNTER — Other Ambulatory Visit: Payer: Self-pay | Admitting: Internal Medicine

## 2020-01-26 DIAGNOSIS — Z1231 Encounter for screening mammogram for malignant neoplasm of breast: Secondary | ICD-10-CM

## 2020-05-18 ENCOUNTER — Encounter (HOSPITAL_COMMUNITY): Payer: Self-pay

## 2020-05-18 ENCOUNTER — Ambulatory Visit (HOSPITAL_COMMUNITY)
Admission: EM | Admit: 2020-05-18 | Discharge: 2020-05-18 | Disposition: A | Discharge status: Home / Self Care | Payer: BC Managed Care – PPO | Attending: Emergency Medicine | Admitting: Emergency Medicine

## 2020-05-18 ENCOUNTER — Other Ambulatory Visit: Payer: Self-pay

## 2020-05-18 DIAGNOSIS — M79675 Pain in left toe(s): Secondary | ICD-10-CM

## 2020-05-18 MED ORDER — IBUPROFEN 600 MG PO TABS: 600.0000 mg | ORAL_TABLET | Freq: Four times a day (QID) | ORAL | 0 refills | Status: DC | PRN

## 2020-05-18 NOTE — ED Triage Notes (Signed)
Pt c/o left toe pain pain. Pt states a couple weeks ago she injured it and states she wants to make sure it is not broken. Pt states her to is throbbing every now and then.

## 2020-05-18 NOTE — Discharge Instructions (Addendum)
Take the ibuprofen as prescribed.  Rest and elevate your toe.   Follow up with an orthopedist if your symptoms are not improving.

## 2020-05-18 NOTE — ED Provider Notes (Signed)
[facility]    CSN: [id number] Arrival date & time: 05/18/20  3614      History   Chief Complaint Chief Complaint  Patient presents with  . Toe Injury  . Toe Pain    HPI Sabrina Baldwin is a 70 y.o. female.   Patient presents with pain in her left fifth toe for 2 to 3 weeks.  She states she stumped her toe on a piece of furniture while walking at home.  She describes the pain as intermittent and throbbing.  She denies numbness, weakness, paresthesias, open wounds, redness, bruising, or other symptoms.  OTC treatment attempted at home  Her medical history includes hypertension and dyslipidemia.  The history is provided by the patient and medical records.    Past Medical History:  Diagnosis Date  . Dyslipidemia   . Hypertension     Patient Active Problem List   Diagnosis Date Noted  . Essential hypertension   . Dyslipidemia     Past Surgical History:  Procedure Laterality Date  . APPENDECTOMY    . CHOLECYSTECTOMY    . NM MYOCAR PERF WALL MOTION  08/15/2006   protocol:Bruce, post stress EF:71%, Exercise Capacity , EKG negative for Ischemia.   . TONSILLECTOMY    . TOTAL VAGINAL HYSTERECTOMY      OB History    Gravida  1   Para  1   Term  1   Preterm      AB      Living  1     SAB      IAB      Ectopic      Multiple      Live Births               Home Medications    Prior to Admission medications   Medication Sig Start Date End Date Taking? Authorizing Provider  ibuprofen (ADVIL) 600 MG tablet Take 1 tablet (600 mg total) by mouth every 6 (six) hours as needed. 05/18/20  Yes Mickie Bail, NP  lisinopril-hydrochlorothiazide (PRINZIDE,ZESTORETIC) 20-25 MG tablet Take 1 tablet by mouth daily. Need appointment before anymore refills 12/28/15  Yes Marykay Lex, MD  traMADol (ULTRAM) 50 MG tablet Take 1 tablet (50 mg total) by mouth every 6 (six) hours as needed. 05/17/19   Geoffery Lyons, MD    Family History Family History   Problem Relation Age of Onset  . Cancer Mother   . Hypertension Brother     Social History Social History   Tobacco Use  . Smoking status: Never Smoker  . Smokeless tobacco: Never Used  Substance Use Topics  . Alcohol use: Yes    Alcohol/week: 2.0 - 3.0 standard drinks    Types: 2 - 3 Glasses of wine per week    Comment: occassionally  . Drug use: No     Allergies   Patient has no known allergies.   Review of Systems Review of Systems  Constitutional: Negative for chills and fever.  HENT: Negative for ear pain and sore throat.   Eyes: Negative for pain and visual disturbance.  Respiratory: Negative for cough and shortness of breath.   Cardiovascular: Negative for chest pain and palpitations.  Gastrointestinal: Negative for abdominal pain and vomiting.  Genitourinary: Negative for dysuria and hematuria.  Musculoskeletal: Positive for arthralgias. Negative for back pain and gait problem.  Skin: Negative for color change, rash and wound.  Neurological: Negative for syncope, weakness and numbness.  All other systems reviewed  and are negative.    Physical Exam Triage Vital Signs ED Triage Vitals  Enc Vitals Group     BP 05/18/20 1025 135/68     Pulse Rate 05/18/20 1025 70     Resp 05/18/20 1025 16     Temp 05/18/20 1025 98.5 F (36.9 C)     Temp Source 05/18/20 1025 Oral     SpO2 05/18/20 1025 99 %     Weight --      Height --      Head Circumference --      Peak Flow --      Pain Score 05/18/20 1024 6     Pain Loc --      Pain Edu? --      Excl. in GC? --    No data found.  Updated Vital Signs BP 135/68 (BP Location: Right Arm)   Pulse 70   Temp 98.5 F (36.9 C) (Oral)   Resp 16   SpO2 99%   Visual Acuity Right Eye Distance:   Left Eye Distance:   Bilateral Distance:    Right Eye Near:   Left Eye Near:    Bilateral Near:     Physical Exam Vitals and nursing note reviewed.  Constitutional:      General: She is not in acute distress.     Appearance: She is well-developed. She is not ill-appearing.  HENT:     Head: Normocephalic and atraumatic.     Mouth/Throat:     Mouth: Mucous membranes are moist.  Eyes:     Conjunctiva/sclera: Conjunctivae normal.  Cardiovascular:     Rate and Rhythm: Normal rate and regular rhythm.     Heart sounds: Normal heart sounds.  Pulmonary:     Effort: Pulmonary effort is normal. No respiratory distress.     Breath sounds: Normal breath sounds.  Abdominal:     Palpations: Abdomen is soft.     Tenderness: There is no abdominal tenderness.  Musculoskeletal:        General: No swelling, tenderness or deformity. Normal range of motion.     Cervical back: Neck supple.     Comments: Left foot and toes nontender to palpation.  No swelling.  No wounds, erythema, ecchymosis.  Skin:    General: Skin is warm and dry.     Capillary Refill: Capillary refill takes less than 2 seconds.     Findings: No bruising, erythema, lesion or rash.  Neurological:     General: No focal deficit present.     Mental Status: She is alert and oriented to person, place, and time.     Sensory: No sensory deficit.     Motor: No weakness.     Gait: Gait normal.  Psychiatric:        Mood and Affect: Mood normal.        Behavior: Behavior normal.      UC Treatments / Results  Labs (all labs ordered are listed, but only abnormal results are displayed) Labs Reviewed - No data to display  EKG   Radiology No results found.  Procedures Procedures (including critical care time)  Medications Ordered in UC Medications - No data to display  Initial Impression / Assessment and Plan / UC Course  I have reviewed the triage vital signs and the nursing notes.  Pertinent labs & imaging results that were available during my care of the patient were reviewed by me and considered in my medical decision making (see chart for  details).   Left 5th toe pain.  Patient declines x-ray.  Treating with ibuprofen, rest,  elevation.  Work note provided per patient request.  Instructed her to follow-up with orthopedics if her symptoms or not improving.  She agrees to plan of care.   Final Clinical Impressions(s) / UC Diagnoses   Final diagnoses:  Pain in toe of left foot     Discharge Instructions     Take the ibuprofen as prescribed.  Rest and elevate your toe.   Follow up with an orthopedist if your symptoms are not improving.       ED Prescriptions    Medication Sig Dispense Auth. Provider   ibuprofen (ADVIL) 600 MG tablet Take 1 tablet (600 mg total) by mouth every 6 (six) hours as needed. 30 tablet Mickie Bail, NP     PDMP not reviewed this encounter.   Mickie Bail, NP 05/18/20 1051

## 2020-08-17 ENCOUNTER — Ambulatory Visit (HOSPITAL_COMMUNITY)
Admission: EM | Admit: 2020-08-17 | Discharge: 2020-08-17 | Disposition: A | Discharge status: Home / Self Care | Payer: BC Managed Care – PPO | Attending: Medical Oncology | Admitting: Medical Oncology

## 2020-08-17 ENCOUNTER — Other Ambulatory Visit: Payer: Self-pay

## 2020-08-17 ENCOUNTER — Encounter (HOSPITAL_COMMUNITY): Payer: Self-pay

## 2020-08-17 DIAGNOSIS — T7840XA Allergy, unspecified, initial encounter: Secondary | ICD-10-CM

## 2020-08-17 DIAGNOSIS — T63441A Toxic effect of venom of bees, accidental (unintentional), initial encounter: Secondary | ICD-10-CM | POA: Diagnosis not present

## 2020-08-17 MED ORDER — EPINEPHRINE 0.3 MG/0.3ML IJ SOAJ: 0.3000 mg | INTRAMUSCULAR | 0 refills | Status: AC | PRN

## 2020-08-17 MED ORDER — DIPHENHYDRAMINE HCL 25 MG PO CAPS
ORAL_CAPSULE | ORAL | Status: AC
  Filled 2020-08-17: qty 1

## 2020-08-17 MED ORDER — DIPHENHYDRAMINE HCL 25 MG PO CAPS
25.0000 mg | ORAL_CAPSULE | Freq: Once | ORAL | Status: AC
  Administered 2020-08-17: 25 mg via ORAL

## 2020-08-17 MED ORDER — PREDNISONE 10 MG (21) PO TBPK: ORAL_TABLET | Freq: Every day | ORAL | 0 refills | Status: DC

## 2020-08-17 MED ORDER — FAMOTIDINE 20 MG PO TABS
20.0000 mg | ORAL_TABLET | Freq: Every day | ORAL | Status: DC
  Administered 2020-08-17: 20 mg via ORAL

## 2020-08-17 MED ORDER — FAMOTIDINE 20 MG PO TABS
ORAL_TABLET | ORAL | Status: AC
  Filled 2020-08-17: qty 1

## 2020-08-17 NOTE — ED Triage Notes (Signed)
Pt was stung by a bee last night on right elbow. Pt now with localized swelling, pain and tingling feeling to the area.   Pt has treated area with neosporin, alcohol and calamine lotion.

## 2020-08-17 NOTE — ED Provider Notes (Signed)
[facility]    CSN: [id number] Arrival date & time: 08/17/20  0803      History   Chief Complaint Chief Complaint  Patient presents with   Insect Bite    HPI Sabrina Baldwin is a 70 y.o. female.   HPI  Insect Bite: Patient reports that last evening she was stung by a bee on her right inner elbow.  She states initially this was painful but she has noticed redness and swelling along with some numbness and tingling of the area.  She denies any fever, vomiting, trouble breathing or swallowing.  She is not having any facial swelling.  She does not recall a previous reaction to bee stings in the past. She has applied alcohol, neosporin, topical steroid to the area without relief.  Past Medical History:  Diagnosis Date   Dyslipidemia    Hypertension     Patient Active Problem List   Diagnosis Date Noted   Essential hypertension    Dyslipidemia     Past Surgical History:  Procedure Laterality Date   APPENDECTOMY     CHOLECYSTECTOMY     NM MYOCAR PERF WALL MOTION  08/15/2006   protocol:Bruce, post stress EF:71%, Exercise Capacity , EKG negative for Ischemia.    TONSILLECTOMY     TOTAL VAGINAL HYSTERECTOMY      OB History     Gravida  1   Para  1   Term  1   Preterm      AB      Living  1      SAB      IAB      Ectopic      Multiple      Live Births               Home Medications    Prior to Admission medications   Medication Sig Start Date End Date Taking? Authorizing Provider  EPINEPHrine 0.3 mg/0.3 mL IJ SOAJ injection Inject 0.3 mg into the muscle as needed for anaphylaxis. 08/17/20  Yes Julian Medina M, PA-C  lisinopril-hydrochlorothiazide (PRINZIDE,ZESTORETIC) 20-25 MG tablet Take 1 tablet by mouth daily. Need appointment before anymore refills 12/28/15  Yes Marykay Lex, MD  ibuprofen (ADVIL) 600 MG tablet Take 1 tablet (600 mg total) by mouth every 6 (six) hours as needed. 05/18/20   Mickie Bail, NP    Family  History Family History  Problem Relation Age of Onset   Cancer Mother    Cancer Father    Hypertension Brother     Social History Social History   Tobacco Use   Smoking status: Never   Smokeless tobacco: Never  Substance Use Topics   Alcohol use: Yes    Alcohol/week: 2.0 - 3.0 standard drinks    Types: 2 - 3 Glasses of wine per week    Comment: occassionally   Drug use: No     Allergies   Bee venom   Review of Systems Review of Systems  As stated above in HPI Physical Exam Triage Vital Signs ED Triage Vitals  Enc Vitals Group     BP 08/17/20 0821 127/70     Pulse Rate 08/17/20 0821 63     Resp 08/17/20 0821 18     Temp 08/17/20 0821 98.8 F (37.1 C)     Temp src --      SpO2 08/17/20 0821 98 %     Weight --      Height --  Head Circumference --      Peak Flow --      Pain Score 08/17/20 0818 8     Pain Loc --      Pain Edu? --      Excl. in GC? --    No data found.  Updated Vital Signs BP 127/70   Pulse 63   Temp 98.8 F (37.1 C)   Resp 18   SpO2 98%   Physical Exam Vitals and nursing note reviewed.  Constitutional:      Appearance: Normal appearance.  HENT:     Head:     Comments: NO facial swelling    Mouth/Throat:     Pharynx: Oropharynx is clear.  Cardiovascular:     Rate and Rhythm: Normal rate and regular rhythm.     Heart sounds: Normal heart sounds.  Pulmonary:     Effort: Pulmonary effort is normal.     Breath sounds: Normal breath sounds.  Musculoskeletal:     Cervical back: Normal range of motion and neck supple.  Skin:    General: Skin is warm.     Comments: There is edema and erythema of the right inner elbow  Neurological:     Mental Status: She is alert.    UC Treatments / Results  Labs (all labs ordered are listed, but only abnormal results are displayed) Labs Reviewed - No data to display  EKG   Radiology No results found.  Procedures Procedures (including critical care time)  Medications Ordered  in UC Medications  diphenhydrAMINE (BENADRYL) capsule 25 mg (has no administration in time range)  famotidine (PEPCID) tablet 20 mg (has no administration in time range)    Initial Impression / Assessment and Plan / UC Course  I have reviewed the triage vital signs and the nursing notes.  Pertinent labs & imaging results that were available during my care of the patient were reviewed by me and considered in my medical decision making (see chart for details).     New. Allergic reaction without anaphylaxis. Treating in office with Benadryl and Pepcid AC.  Sending her home with prednisone and an EpiPen.  We educated her on how to use the EpiPen, when to use an EpiPen, and that she needs to call 911 after use. Discussed red flag signs and symptoms.  Final Clinical Impressions(s) / UC Diagnoses   Final diagnoses:  Bee sting, accidental or unintentional, initial encounter  Allergic reaction, initial encounter   Discharge Instructions   None    ED Prescriptions     Medication Sig Dispense Auth. Provider   EPINEPHrine 0.3 mg/0.3 mL IJ SOAJ injection Inject 0.3 mg into the muscle as needed for anaphylaxis. 1 each Rushie Chestnut, PA-C      PDMP not reviewed this encounter.   Rushie Chestnut, [place] 08/17/20 [phone]

## 2021-02-02 ENCOUNTER — Ambulatory Visit (HOSPITAL_COMMUNITY)
Admission: EM | Admit: 2021-02-02 | Discharge: 2021-02-02 | Disposition: A | Discharge status: Home / Self Care | Payer: BC Managed Care – PPO | Attending: Emergency Medicine | Admitting: Emergency Medicine

## 2021-02-02 ENCOUNTER — Other Ambulatory Visit: Payer: Self-pay

## 2021-02-02 ENCOUNTER — Encounter (HOSPITAL_COMMUNITY): Payer: Self-pay | Admitting: Emergency Medicine

## 2021-02-02 DIAGNOSIS — L232 Allergic contact dermatitis due to cosmetics: Secondary | ICD-10-CM | POA: Diagnosis not present

## 2021-02-02 MED ORDER — HYDROCORTISONE 1 % EX CREA: TOPICAL_CREAM | CUTANEOUS | 0 refills | Status: DC

## 2021-02-02 MED ORDER — CETIRIZINE HCL 10 MG PO TABS: 10.0000 mg | ORAL_TABLET | Freq: Every day | ORAL | 0 refills | Status: DC

## 2021-02-02 NOTE — ED Triage Notes (Signed)
Pt is present today with a possible rash on the back her neck. Pt states she noticed the rash x2 days ago. Pt states she thinks its due to her changing soaps recently.

## 2021-02-02 NOTE — ED Provider Notes (Signed)
[facility]    CSN: [id number] Arrival date & time: 02/02/21  9357      History   Chief Complaint Chief Complaint  Patient presents with   Rash    HPI Sabrina Baldwin is a 70 y.o. female.   Presents with rash on the back of the neck for 2 days after change in soaps.  Rash is pruritic and patient endorses scratching.  Some improvement after use of diclofenac gel over area.  Denies drainage, fever, chills.  Rash not occurred before.  History of hypertension and dyslipidemia.  Past Medical History:  Diagnosis Date   Dyslipidemia    Hypertension     Patient Active Problem List   Diagnosis Date Noted   Essential hypertension    Dyslipidemia     Past Surgical History:  Procedure Laterality Date   APPENDECTOMY     CHOLECYSTECTOMY     NM MYOCAR PERF WALL MOTION  08/15/2006   protocol:Bruce, post stress EF:71%, Exercise Capacity , EKG negative for Ischemia.    TONSILLECTOMY     TOTAL VAGINAL HYSTERECTOMY      OB History     Gravida  1   Para  1   Term  1   Preterm      AB      Living  1      SAB      IAB      Ectopic      Multiple      Live Births               Home Medications    Prior to Admission medications   Medication Sig Start Date End Date Taking? Authorizing Provider  cetirizine (ZYRTEC ALLERGY) 10 MG tablet Take 1 tablet (10 mg total) by mouth daily. 02/02/21  Yes Salli Quarry R, NP  hydrocortisone cream 1 % Apply to affected area 2 times daily 02/02/21  Yes Abbygael Curtiss R, NP  EPINEPHrine 0.3 mg/0.3 mL IJ SOAJ injection Inject 0.3 mg into the muscle as needed for anaphylaxis. 08/17/20   Rushie Chestnut, PA-C  ibuprofen (ADVIL) 600 MG tablet Take 1 tablet (600 mg total) by mouth every 6 (six) hours as needed. 05/18/20   Mickie Bail, NP  lisinopril-hydrochlorothiazide (PRINZIDE,ZESTORETIC) 20-25 MG tablet Take 1 tablet by mouth daily. Need appointment before anymore refills 12/28/15   Marykay Lex,  MD    Family History Family History  Problem Relation Age of Onset   Cancer Mother    Cancer Father    Hypertension Brother     Social History Social History   Tobacco Use   Smoking status: Never   Smokeless tobacco: Never  Substance Use Topics   Alcohol use: Yes    Alcohol/week: 2.0 - 3.0 standard drinks    Types: 2 - 3 Glasses of wine per week    Comment: occassionally   Drug use: No     Allergies   Bee venom   Review of Systems Review of Systems  Constitutional: Negative.   Skin:  Positive for rash. Negative for color change, pallor and wound.  Neurological: Negative.     Physical Exam Triage Vital Signs ED Triage Vitals  Enc Vitals Group     BP 02/02/21 0846 (!) 151/81     Pulse Rate 02/02/21 0846 81     Resp 02/02/21 0846 18     Temp 02/02/21 0846 98.3 F (36.8 C)     Temp Source 02/02/21 0846  Oral     SpO2 02/02/21 0846 95 %     Weight --      Height --      Head Circumference --      Peak Flow --      Pain Score 02/02/21 0845 0     Pain Loc --      Pain Edu? --      Excl. in GC? --    No data found.  Updated Vital Signs BP (!) 151/81    Pulse 81    Temp 98.3 F (36.8 C) (Oral)    Resp 18    SpO2 95%   Visual Acuity Right Eye Distance:   Left Eye Distance:   Bilateral Distance:    Right Eye Near:   Left Eye Near:    Bilateral Near:     Physical Exam Constitutional:      Appearance: Normal appearance. She is normal weight.  Eyes:     Extraocular Movements: Extraocular movements intact.  Pulmonary:     Effort: Pulmonary effort is normal.  Skin:    Comments: Dry eczematous rash with excoriations present on the posterior neck, dry cracking skin on the bilateral external ears, photo on neck below  Neurological:     Mental Status: She is alert and oriented to person, place, and time. Mental status is at baseline.  Psychiatric:        Mood and Affect: Mood normal.        Behavior: Behavior normal.      UC Treatments / Results   Labs (all labs ordered are listed, but only abnormal results are displayed) Labs Reviewed - No data to display  EKG   Radiology No results found.  Procedures Procedures (including critical care time)  Medications Ordered in UC Medications - No data to display  Initial Impression / Assessment and Plan / UC Course  I have reviewed the triage vital signs and the nursing notes.  Pertinent labs & imaging results that were available during my care of the patient were reviewed by me and considered in my medical decision making (see chart for details).  Allergic contact dermatitis due to cosmetics  1.  Hydrocortisone 1% cream twice daily 2.  Zyrtec 10 mg daily 3.  Urgent care follow-up for persistent or reoccurring rash  Final Clinical Impressions(s) / UC Diagnoses   Final diagnoses:  Allergic contact dermatitis due to cosmetics   Discharge Instructions   None    ED Prescriptions     Medication Sig Dispense Auth. Provider   hydrocortisone cream 1 % Apply to affected area 2 times daily 15 g Alika Eppes R, NP   cetirizine (ZYRTEC ALLERGY) 10 MG tablet Take 1 tablet (10 mg total) by mouth daily. 30 tablet Valinda Hoar, NP      PDMP not reviewed this encounter.   Valinda Hoar, [place] 02/02/21 [phone]

## 2021-02-02 NOTE — Discharge Instructions (Signed)
apply hydrocortisone cream to your neck and behind your ears twice a day until area clears  You may take Zyrtec daily as needed to help calm itching  discard soap that caused irritation and return to your normal products  You may follow-up with urgent care for worsening or persistent rash as needed

## 2021-03-02 ENCOUNTER — Other Ambulatory Visit: Payer: Self-pay | Admitting: Internal Medicine

## 2021-03-02 DIAGNOSIS — Z0000 Encounter for general adult medical examination without abnormal findings: Secondary | ICD-10-CM

## 2021-05-02 ENCOUNTER — Ambulatory Visit
Admission: RE | Admit: 2021-05-02 | Discharge: 2021-05-02 | Disposition: A | Discharge status: Home / Self Care | Payer: BC Managed Care – PPO | Source: Ambulatory Visit | Attending: Internal Medicine | Admitting: Internal Medicine

## 2021-05-02 DIAGNOSIS — Z0000 Encounter for general adult medical examination without abnormal findings: Secondary | ICD-10-CM

## 2022-02-05 ENCOUNTER — Encounter (HOSPITAL_COMMUNITY): Payer: Self-pay | Admitting: *Deleted

## 2022-02-05 ENCOUNTER — Ambulatory Visit (HOSPITAL_COMMUNITY)
Admission: EM | Admit: 2022-02-05 | Discharge: 2022-02-05 | Disposition: A | Discharge status: Home / Self Care | Payer: BC Managed Care – PPO | Attending: Family Medicine | Admitting: Family Medicine

## 2022-02-05 DIAGNOSIS — S60351A Superficial foreign body of right thumb, initial encounter: Secondary | ICD-10-CM

## 2022-02-05 MED ORDER — DOXYCYCLINE HYCLATE 100 MG PO CAPS: 100.0000 mg | ORAL_CAPSULE | Freq: Two times a day (BID) | ORAL | 0 refills | Status: DC

## 2022-02-05 NOTE — ED Triage Notes (Signed)
Pt states she got a splinter in her right thumb last week and there was a pocket of puss on Friday that drained, but her thumb is sore still and she can see the splinter.

## 2022-02-07 NOTE — ED Provider Notes (Signed)
[facility]   [id number] 02/05/22 Arrival Time: 4132  ASSESSMENT & PLAN:  1. Foreign body of right thumb, initial encounter     Incision and Drainage Procedure Note  Anesthesia: 1% plain lidocaine  Procedure Details  The procedure, risks and complications have been discussed in detail (including, but not limited to pain and bleeding) with the patient.  The skin induration was prepped and draped in the usual fashion. After adequate local anesthesia and finger tourniquet placement (total < 10 min) an approx 0.5 mm incision made over site of FB entry with a #11 blade. Dark FB identified and removed with a hemostat. No complications. Tourniquet removed. Bleeding controlled. Bandaged. Rx give for doxycycline should the wound show any s/s of infection.  Meds ordered this encounter  Medications   doxycycline (VIBRAMYCIN) 100 MG capsule    Sig: Take 1 capsule (100 mg total) by mouth 2 (two) times daily.    Dispense:  14 capsule    Refill:  0   Tylenol if needed.  Reviewed expectations re: course of current medical issues. Questions answered. Outlined signs and symptoms indicating need for more acute intervention. Patient verbalized understanding. After Visit Summary given.   SUBJECTIVE:  Adrienne Trombetta is a 71 y.o. female who reports a wooden splinter in her right thumb; since last week. Area is painful. Slight "pus" from area several days ago. No current bleeding or drainage. Afebrile. No extremity sensation changes or weakness.   OBJECTIVE:  Vitals:   02/05/22 0849  BP: 134/78  Pulse: 77  Resp: 18  Temp: 98.9 F (37.2 C)  TempSrc: Oral  SpO2: 94%    General appearance: alert; no distress RUE: appears to be a dark FB under skin of lateral R thumb; tender to touch; no active drainage or bleeding; thumb with normal distal sensation and capillary refill Psychological: alert and cooperative; normal mood and affect  Allergies  Allergen Reactions   Bee Venom  Other (See Comments)    Bee sting caused localized swelling/tingling per pt    Past Medical History:  Diagnosis Date   Dyslipidemia    Hypertension    Social History   Socioeconomic History   Marital status: Single    Spouse name: Not on file   Number of children: Not on file   Years of education: Not on file   Highest education level: Not on file  Occupational History   Not on file  Tobacco Use   Smoking status: Never   Smokeless tobacco: Never  Substance and Sexual Activity   Alcohol use: Yes    Alcohol/week: 2.0 - 3.0 standard drinks of alcohol    Types: 2 - 3 Glasses of wine per week    Comment: occassionally   Drug use: No   Sexual activity: Yes    Birth control/protection: None  Other Topics Concern   Not on file  Social History Narrative   Single mother. 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 She does not smoke herself. She does lots of walking, but no routine exercise.   Social Determinants of Health   Financial Resource Strain: Not on file  Food Insecurity: Not on file  Transportation Needs: Not on file  Physical Activity: Not on file  Stress: Not on file  Social Connections: Not on file   Family History  Problem Relation Age of Onset   Cancer Mother    Cancer Father    Hypertension Brother    Past Surgical History:  Procedure Laterality Date   APPENDECTOMY     CHOLECYSTECTOMY     NM MYOCAR PERF WALL MOTION  08/15/2006   protocol:Bruce, post stress EF:71%, Exercise Capacity , EKG negative for Ischemia.    TONSILLECTOMY     TOTAL VAGINAL HYSTERECTOMY              Mardella Layman, MD 02/07/22 1346

## 2022-03-30 ENCOUNTER — Other Ambulatory Visit: Payer: Self-pay | Admitting: Internal Medicine

## 2022-03-30 DIAGNOSIS — Z1231 Encounter for screening mammogram for malignant neoplasm of breast: Secondary | ICD-10-CM

## 2022-05-11 ENCOUNTER — Ambulatory Visit
Admission: RE | Admit: 2022-05-11 | Discharge: 2022-05-11 | Disposition: A | Discharge status: Home / Self Care | Payer: BC Managed Care – PPO | Source: Ambulatory Visit | Attending: Internal Medicine | Admitting: Internal Medicine

## 2022-05-11 DIAGNOSIS — Z1231 Encounter for screening mammogram for malignant neoplasm of breast: Secondary | ICD-10-CM

## 2023-03-11 ENCOUNTER — Encounter: Payer: Self-pay | Admitting: Internal Medicine

## 2023-06-25 ENCOUNTER — Other Ambulatory Visit: Payer: Self-pay | Admitting: Internal Medicine

## 2023-06-25 DIAGNOSIS — Z1231 Encounter for screening mammogram for malignant neoplasm of breast: Secondary | ICD-10-CM

## 2023-06-26 ENCOUNTER — Ambulatory Visit
Admission: RE | Admit: 2023-06-26 | Discharge: 2023-06-26 | Discharge status: Home / Self Care | Source: Ambulatory Visit | Attending: Internal Medicine | Admitting: Internal Medicine

## 2023-06-26 DIAGNOSIS — Z1231 Encounter for screening mammogram for malignant neoplasm of breast: Secondary | ICD-10-CM

## 2023-07-29 ENCOUNTER — Encounter (HOSPITAL_COMMUNITY): Payer: Self-pay | Admitting: Emergency Medicine

## 2023-07-29 ENCOUNTER — Ambulatory Visit (HOSPITAL_COMMUNITY)
Admission: EM | Admit: 2023-07-29 | Discharge: 2023-07-29 | Disposition: A | Discharge status: Home / Self Care | Attending: Nurse Practitioner | Admitting: Nurse Practitioner

## 2023-07-29 DIAGNOSIS — L219 Seborrheic dermatitis, unspecified: Secondary | ICD-10-CM | POA: Diagnosis not present

## 2023-07-29 MED ORDER — PREDNISONE 20 MG PO TABS: 40.0000 mg | ORAL_TABLET | Freq: Every day | ORAL | 0 refills | Status: AC

## 2023-07-29 MED ORDER — FLUCONAZOLE 150 MG PO TABS: 150.0000 mg | ORAL_TABLET | Freq: Once | ORAL | 0 refills | Status: AC

## 2023-07-29 MED ORDER — CLOTRIMAZOLE-BETAMETHASONE 1-0.05 % EX CREA: TOPICAL_CREAM | Freq: Two times a day (BID) | CUTANEOUS | 0 refills | Status: AC

## 2023-07-29 NOTE — ED Provider Notes (Signed)
 [facility]    CSN: [id number] Arrival date & time: 07/29/23  0805      History   Chief Complaint Chief Complaint  Patient presents with   Rash    HPI Sabrina Baldwin is a 73 y.o. female.   Sabrina Baldwin is a 73 y.o. female that presents with a persistent rash behind both ears that has been present for at least a couple of months. The patient reports using over-the-counter hydrocortisone  cream for the rash, which has provided some relief but has not completely resolved the issue. The rash is described as itchy and flaky, with the patient noting that they try not to scratch it and instead rub the affected area. The patient denies any new hair products or injuries from their barber that could have caused the rash. They also mention that the area where the rash is located seems to have less hair growth compared to unaffected areas. The patient reports no history of similar rashes or rashes in other locations. They have been washing their hair with patent sugar, which has made the rash more noticeable. The patient states that the rash does not hurt when scratched, only itches. They have informed their barber about the condition, and the barber has observed it. The patient recently had a haircut a couple of days ago and typically gets haircuts once a month.  The following portions of the patient's history were reviewed and updated as appropriate: allergies, current medications, past family history, past medical history, past social history, past surgical history, and problem list.    Past Medical History:  Diagnosis Date   Dyslipidemia    Hypertension     Patient Active Problem List   Diagnosis Date Noted   Essential hypertension    Dyslipidemia     Past Surgical History:  Procedure Laterality Date   APPENDECTOMY     CHOLECYSTECTOMY     NM MYOCAR PERF WALL MOTION  08/15/2006   protocol:Bruce, post stress EF:71%, Exercise Capacity , EKG negative for Ischemia.     TONSILLECTOMY     TOTAL VAGINAL HYSTERECTOMY      OB History     Gravida  1   Para  1   Term  1   Preterm      AB      Living  1      SAB      IAB      Ectopic      Multiple      Live Births               Home Medications    Prior to Admission medications   Medication Sig Start Date End Date Taking? Authorizing Provider  clotrimazole-betamethasone (LOTRISONE) cream Apply topically 2 (two) times daily. Apply to affected area 2 times daily 07/29/23  Yes Maryruth Sol, FNP  fluconazole (DIFLUCAN) 150 MG tablet Take 1 tablet (150 mg total) by mouth once for 1 dose. 07/29/23 07/29/23 Yes Arnold Kester, FNP  predniSONE  (DELTASONE ) 20 MG tablet Take 2 tablets (40 mg total) by mouth daily for 5 days. 07/29/23 08/03/23 Yes Maryruth Sol, FNP  EPINEPHrine  0.3 mg/0.3 mL IJ SOAJ injection Inject 0.3 mg into the muscle as needed for anaphylaxis. 08/17/20   Sharla Davis, PA-C  lisinopril -hydrochlorothiazide  (PRINZIDE ,ZESTORETIC ) 20-25 MG tablet Take 1 tablet by mouth daily. Need appointment before anymore refills 12/28/15   Arleen Lacer, MD    Family History Family History  Problem Relation Age of Onset   Cancer  Mother    Cancer Father    Hypertension Brother     Social History Social History   Tobacco Use   Smoking status: Never   Smokeless tobacco: Never  Substance Use Topics   Alcohol use: Yes    Alcohol/week: 2.0 - 3.0 standard drinks of alcohol    Types: 2 - 3 Glasses of wine per week    Comment: occassionally   Drug use: No     Allergies   Bee venom   Review of Systems Review of Systems  Skin:  Positive for rash.  All other systems reviewed and are negative.    Physical Exam Triage Vital Signs ED Triage Vitals  Encounter Vitals Group     BP 07/29/23 0828 122/73     Systolic BP Percentile --      Diastolic BP Percentile --      Pulse Rate 07/29/23 0828 64     Resp 07/29/23 0828 15     Temp 07/29/23 0828 98.1 F (36.7 C)      Temp Source 07/29/23 0828 Oral     SpO2 07/29/23 0828 96 %     Weight --      Height --      Head Circumference --      Peak Flow --      Pain Score 07/29/23 0827 0     Pain Loc --      Pain Education --      Exclude from Growth Chart --    No data found.  Updated Vital Signs BP 122/73 (BP Location: Left Arm)   Pulse 64   Temp 98.1 F (36.7 C) (Oral)   Resp 15   SpO2 96%   Visual Acuity Right Eye Distance:   Left Eye Distance:   Bilateral Distance:    Right Eye Near:   Left Eye Near:    Bilateral Near:     Physical Exam Vitals reviewed.  Constitutional:      General: She is not in acute distress.    Appearance: Normal appearance. She is not toxic-appearing.  HENT:     Head: Normocephalic. Hair is abnormal.      Comments: Flaky, smooth, and hypopigmented noted above the ear on the scalp, bilaterally. No surrounding erythema, drainage, swelling or tenderness. Scant amount of hair growth noted within these areas in comparison to the rest of the scalp     Mouth/Throat:     Mouth: Mucous membranes are moist.  Eyes:     Conjunctiva/sclera: Conjunctivae normal.  Cardiovascular:     Rate and Rhythm: Normal rate and regular rhythm.     Heart sounds: Normal heart sounds.  Pulmonary:     Effort: Pulmonary effort is normal.     Breath sounds: Normal breath sounds.  Musculoskeletal:        General: Normal range of motion.  Skin:    General: Skin is warm and dry.     Findings: Rash present.     Comments: See above under HENT for details   Neurological:     General: No focal deficit present.     Mental Status: She is alert and oriented to person, place, and time.      UC Treatments / Results  Labs (all labs ordered are listed, but only abnormal results are displayed) Labs Reviewed - No data to display  EKG   Radiology No results found.  Procedures Procedures (including critical care time)  Medications Ordered in UC Medications - No  data to  display  Initial Impression / Assessment and Plan / UC Course  I have reviewed the triage vital signs and the nursing notes.  Pertinent labs & imaging results that were available during my care of the patient were reviewed by me and considered in my medical decision making (see chart for details).    Patient presents with a persistent, pruritic, scaly rash behind both ears that has been present for over a month. There has been partial improvement with over-the-counter 1% hydrocortisone  cream. The rash is not painful and shows no signs of infection. There is no reported use of new products, and hair thinning is noted in the affected area. The chronicity, location, and partial steroid response raise suspicion for a fungal etiology, possibly compounded by contact dermatitis. A combination antifungal and steroid cream is prescribed along with a short course of oral prednisone  and a single dose of fluconazole. The patient was counseled on keeping the area dry, avoiding irritants, and using antifungal shampoo. Dermatology referral recommended if symptoms persist.  Today's evaluation has revealed no signs of a dangerous process. Discussed diagnosis with patient and/or guardian. Patient and/or guardian aware of their diagnosis, possible red flag symptoms to watch out for and need for close follow up. Patient and/or guardian understands verbal and written discharge instructions. Patient and/or guardian comfortable with plan and disposition.  Patient and/or guardian has a clear mental status at this time, good insight into illness (after discussion and teaching) and has clear judgment to make decisions regarding their care  Documentation was completed with the aid of voice recognition software. Transcription may contain typographical errors.  Final Clinical Impressions(s) / UC Diagnoses   Final diagnoses:  Acute seborrheic dermatitis     Discharge Instructions      You are being treated for a  persistent rash behind the ears that appears to have both fungal and inflammatory components. You were prescribed a combination antifungal and steroid cream (Lotrisone) to apply to the affected areas twice daily. You were also given a short course of oral prednisone  and a single dose of fluconazole to help reduce inflammation and treat any underlying fungal infection. Keep the area clean and thoroughly dry it after bathing; you may use a hairdryer on a cool setting if needed. Avoid using hair products such as gels, oils, or sprays near the rash. It is also recommended that you wash your hair immediately after haircuts to reduce exposure to potential irritants. For additional relief, you may use an over-the-counter antifungal shampoo such as Selsun Blue when washing your hair.  Monitor for signs of improvement over the next several days. If symptoms worsen, spread, become painful, develop drainage, or do not begin to improve within 1 to 2 weeks, follow up with your primary care provider or a dermatologist. Seek immediate medical care at an urgent care or emergency department if you develop fever, facial swelling, rapid spreading of the rash, or signs of severe allergic reaction such as difficulty breathing.    ED Prescriptions     Medication Sig Dispense Auth. Provider   clotrimazole-betamethasone (LOTRISONE) cream Apply topically 2 (two) times daily. Apply to affected area 2 times daily 15 g Maryruth Sol, FNP   predniSONE  (DELTASONE ) 20 MG tablet Take 2 tablets (40 mg total) by mouth daily for 5 days. 10 tablet Maryruth Sol, FNP   fluconazole (DIFLUCAN) 150 MG tablet Take 1 tablet (150 mg total) by mouth once for 1 dose. 1 tablet Maryruth Sol, FNP  PDMP not reviewed this encounter.   Beola Brazil Wilson, [place] 07/29/23 [phone]

## 2023-07-29 NOTE — Discharge Instructions (Addendum)
 You are being treated for a persistent rash behind the ears that appears to have both fungal and inflammatory components. You were prescribed a combination antifungal and steroid cream (Lotrisone) to apply to the affected areas twice daily. You were also given a short course of oral prednisone  and a single dose of fluconazole to help reduce inflammation and treat any underlying fungal infection. Keep the area clean and thoroughly dry it after bathing; you may use a hairdryer on a cool setting if needed. Avoid using hair products such as gels, oils, or sprays near the rash. It is also recommended that you wash your hair immediately after haircuts to reduce exposure to potential irritants. For additional relief, you may use an over-the-counter antifungal shampoo such as Selsun Blue when washing your hair.  Monitor for signs of improvement over the next several days. If symptoms worsen, spread, become painful, develop drainage, or do not begin to improve within 1 to 2 weeks, follow up with your primary care provider or a dermatologist. Seek immediate medical care at an urgent care or emergency department if you develop fever, facial swelling, rapid spreading of the rash, or signs of severe allergic reaction such as difficulty breathing.

## 2023-07-29 NOTE — ED Triage Notes (Signed)
 Pt reports rash behind bilateral ears for a month. Reports itching. Using OTC cream that pharmacist recommended but not helping.

## 2023-11-20 IMAGING — MG MM DIGITAL SCREENING BILAT W/ TOMO AND CAD
8 series · 9 of 24 positions shown · non-contrast
Comparison: Previous exam(s).

CLINICAL DATA: Screening.

EXAM:
DIGITAL SCREENING BILATERAL MAMMOGRAM WITH TOMOSYNTHESIS AND CAD
TECHNIQUE: Bilateral screening digital craniocaudal and mediolateral oblique
mammograms were obtained. Bilateral screening digital breast
tomosynthesis was performed. The images were evaluated with
computer-aided detection.

[R MLO synth-2D]
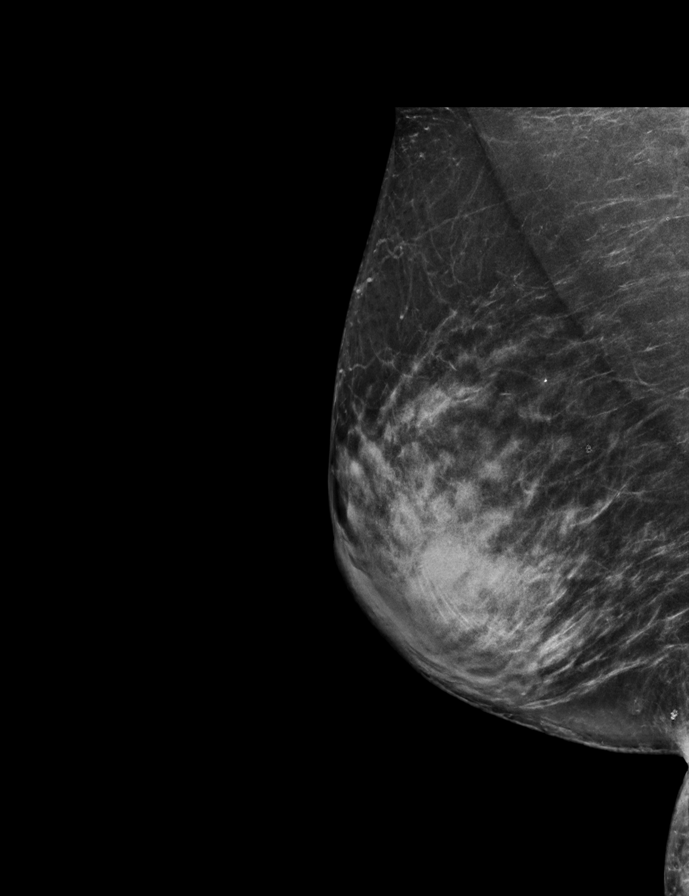

[R CC synth-2D]
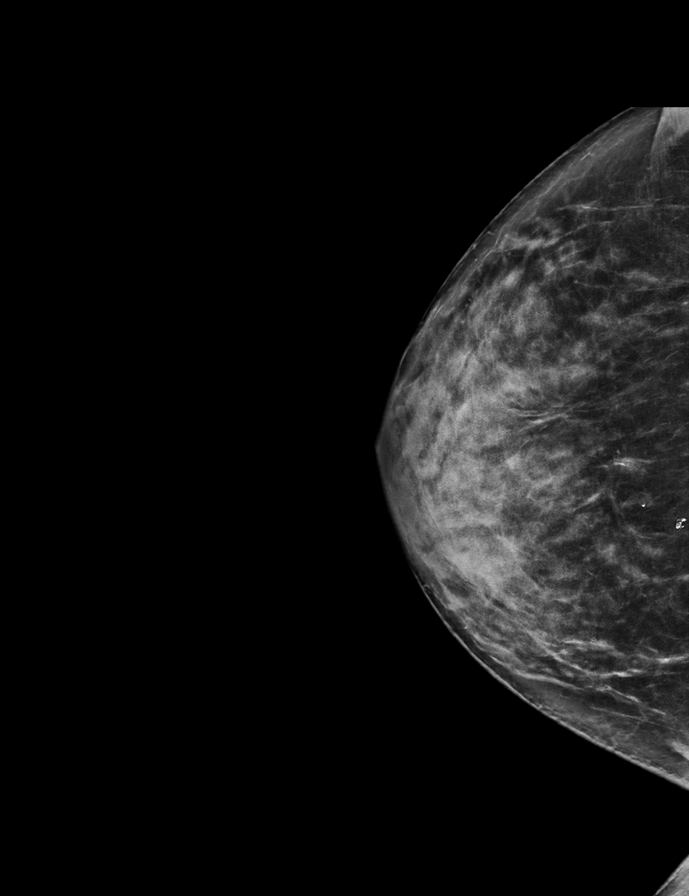

[L MLO synth-2D]
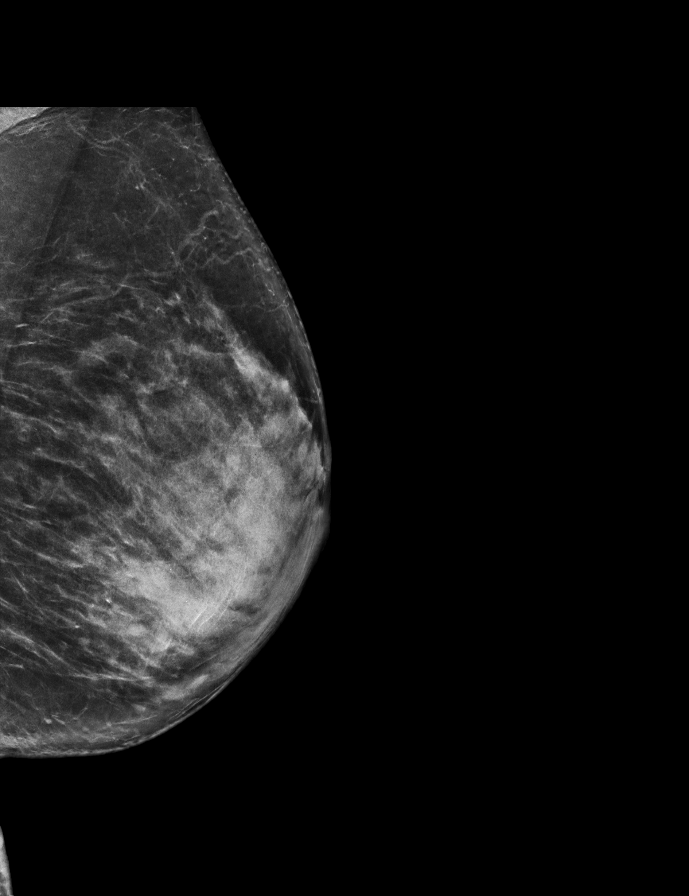

[L CC synth-2D]
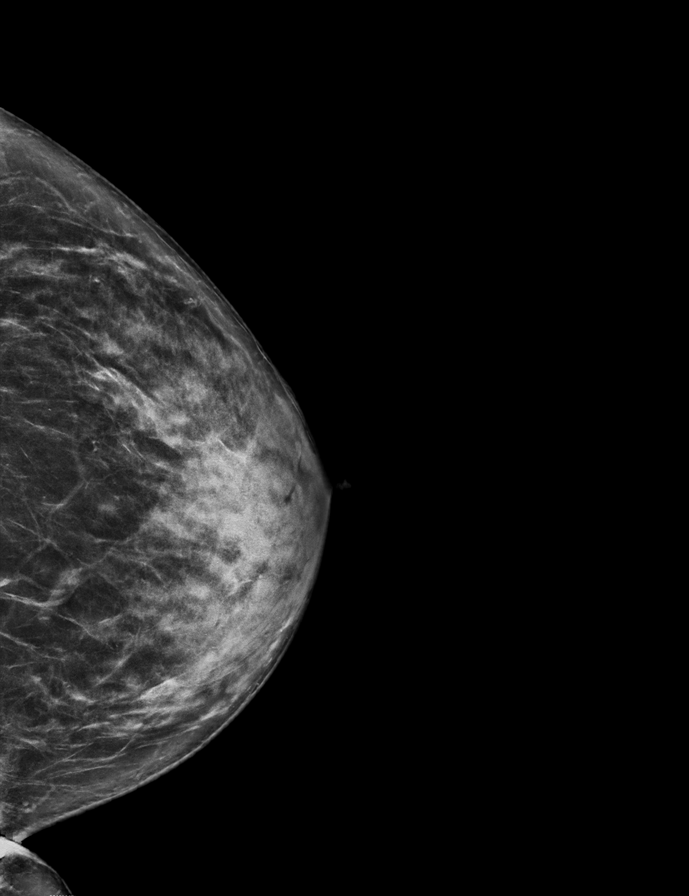

[R MLO tomo · 2 of 76 frames shown]
[frame 25/76]
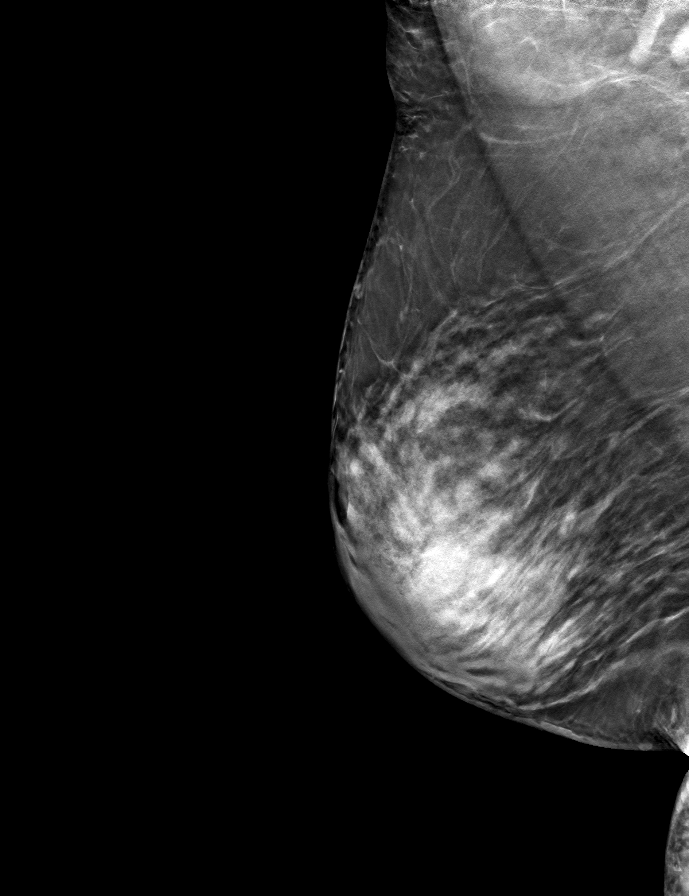
[frame 39/76]
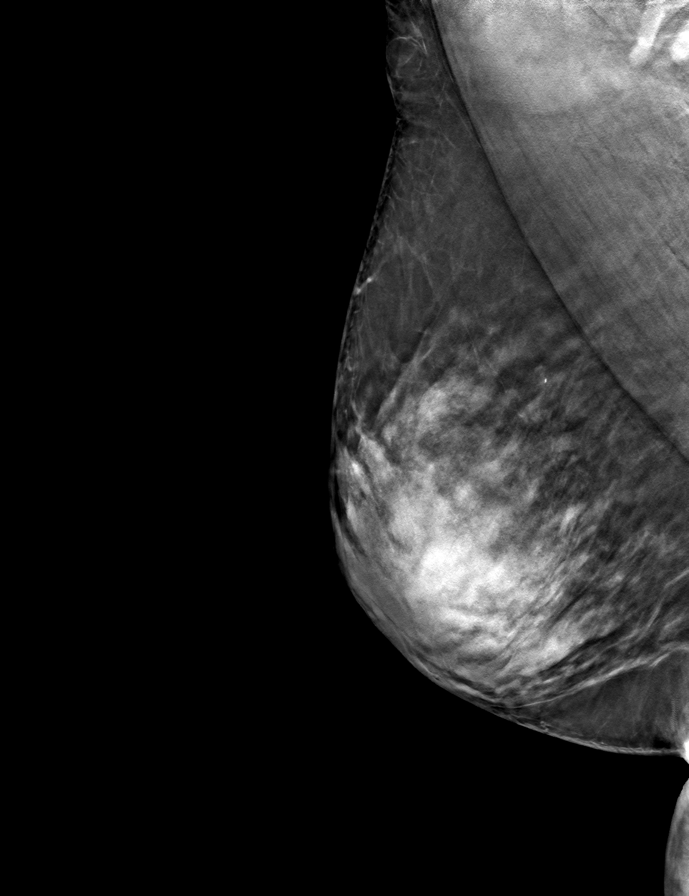

[L CC tomo · tomo slice 35/68.0]
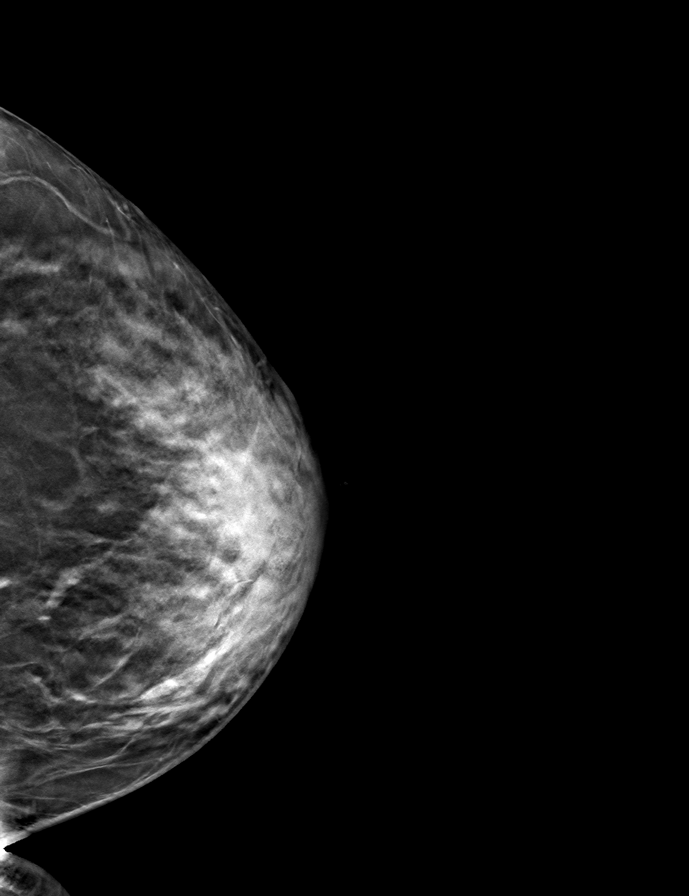

[L MLO tomo · tomo slice 35/68.0]
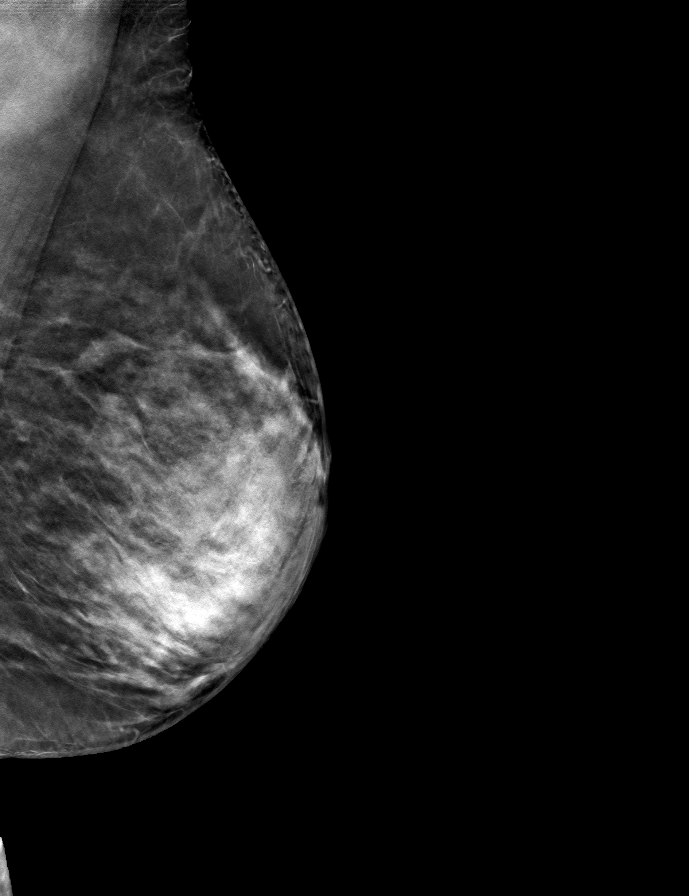

[R CC tomo · tomo slice 36/71.0]
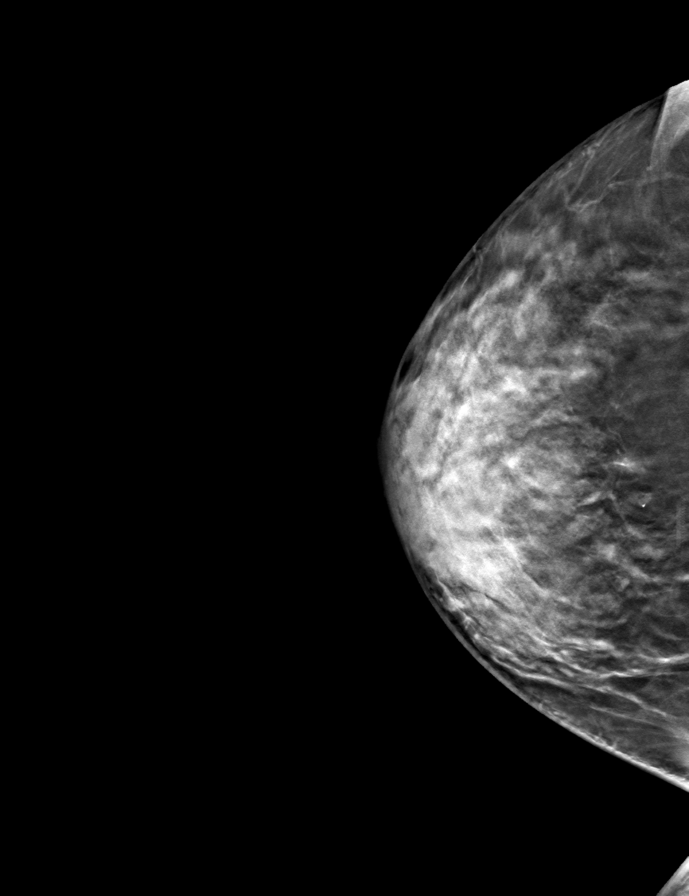

[9 of 24 positions shown; findings below may reference images not displayed]

ACR Breast Density Category c: The breast tissue is heterogeneously
dense, which may obscure small masses.
FINDINGS: There are no findings suspicious for malignancy.
IMPRESSION: No mammographic evidence of malignancy. A result letter of this
screening mammogram will be mailed directly to the patient.

RECOMMENDATION:
Screening mammogram in one year. (Code:Q3-W-BC3)

BI-RADS CATEGORY  1: Negative.
